# Patient Record
Sex: Female | Born: 1985 | Race: White | Hispanic: No | Marital: Single | Smoking: Current every day smoker
Health system: Southern US, Community
[De-identification: ages and names within clinical notes are randomized; demographics above are authoritative.]

## PROBLEM LIST (undated history)

## (undated) DIAGNOSIS — K5900 Constipation, unspecified: Secondary | ICD-10-CM

## (undated) DIAGNOSIS — J302 Other seasonal allergic rhinitis: Secondary | ICD-10-CM

## (undated) HISTORY — DX: Constipation, unspecified: K59.00

## (undated) HISTORY — PX: OTHER SURGICAL HISTORY: SHX169

## (undated) HISTORY — DX: Other seasonal allergic rhinitis: J30.2

---

## 2005-08-07 ENCOUNTER — Other Ambulatory Visit: Admission: RE | Admit: 2005-08-07 | Discharge: 2005-08-07 | Payer: Self-pay | Admitting: Obstetrics and Gynecology

## 2009-02-09 ENCOUNTER — Emergency Department (HOSPITAL_COMMUNITY): Admission: EM | Admit: 2009-02-09 | Discharge: 2009-02-09 | Payer: Self-pay | Admitting: Emergency Medicine

## 2010-10-03 ENCOUNTER — Emergency Department (HOSPITAL_COMMUNITY)
Admission: EM | Admit: 2010-10-03 | Discharge: 2010-10-03 | Disposition: A | Discharge status: Home / Self Care | Payer: Medicaid Other | Attending: Emergency Medicine | Admitting: Emergency Medicine

## 2010-10-03 DIAGNOSIS — R300 Dysuria: Secondary | ICD-10-CM | POA: Insufficient documentation

## 2010-10-03 DIAGNOSIS — M545 Low back pain, unspecified: Secondary | ICD-10-CM | POA: Insufficient documentation

## 2010-10-03 DIAGNOSIS — N390 Urinary tract infection, site not specified: Secondary | ICD-10-CM | POA: Insufficient documentation

## 2010-10-03 LAB — DIFFERENTIAL
Basophils Relative: 0 % (ref 0–1)
Monocytes Absolute: 1.1 10*3/uL — ABNORMAL HIGH (ref 0.1–1.0)
Monocytes Relative: 8 % (ref 3–12)
Neutro Abs: 11.4 10*3/uL — ABNORMAL HIGH (ref 1.7–7.7)

## 2010-10-03 LAB — URINALYSIS, ROUTINE W REFLEX MICROSCOPIC
Bilirubin Urine: NEGATIVE
Nitrite: POSITIVE — AB
Specific Gravity, Urine: 1.025 (ref 1.005–1.030)
Urobilinogen, UA: 0.2 mg/dL (ref 0.0–1.0)

## 2010-10-03 LAB — URINE MICROSCOPIC-ADD ON

## 2010-10-03 LAB — CBC
HCT: 35.3 % — ABNORMAL LOW (ref 36.0–46.0)
Hemoglobin: 11.9 g/dL — ABNORMAL LOW (ref 12.0–15.0)
MCH: 31.7 pg (ref 26.0–34.0)
MCHC: 33.7 g/dL (ref 30.0–36.0)

## 2010-10-03 LAB — BASIC METABOLIC PANEL
CO2: 25 mEq/L (ref 19–32)
Chloride: 105 mEq/L (ref 96–112)
Creatinine, Ser: 0.51 mg/dL (ref 0.4–1.2)
GFR calc Af Amer: >60 mL/min (ref >60)
Glucose, Bld: 117 mg/dL — ABNORMAL HIGH (ref 70–99)

## 2010-10-06 LAB — URINE CULTURE

## 2010-11-04 ENCOUNTER — Ambulatory Visit (HOSPITAL_COMMUNITY)
Admission: RE | Admit: 2010-11-04 | Discharge: 2010-11-04 | Disposition: A | Discharge status: Home / Self Care | Payer: Medicaid Other | Source: Ambulatory Visit | Attending: Urology | Admitting: Urology

## 2010-11-04 ENCOUNTER — Other Ambulatory Visit (HOSPITAL_COMMUNITY): Payer: Self-pay | Admitting: Urology

## 2010-11-04 DIAGNOSIS — N200 Calculus of kidney: Secondary | ICD-10-CM

## 2010-11-04 DIAGNOSIS — R9389 Abnormal findings on diagnostic imaging of other specified body structures: Secondary | ICD-10-CM | POA: Insufficient documentation

## 2010-11-04 DIAGNOSIS — R1031 Right lower quadrant pain: Secondary | ICD-10-CM | POA: Insufficient documentation

## 2010-12-20 ENCOUNTER — Emergency Department (HOSPITAL_COMMUNITY)
Admission: EM | Admit: 2010-12-20 | Discharge: 2010-12-20 | Disposition: A | Discharge status: Home / Self Care | Payer: Medicaid Other | Attending: Emergency Medicine | Admitting: Emergency Medicine

## 2010-12-20 ENCOUNTER — Encounter: Payer: Self-pay | Admitting: Emergency Medicine

## 2010-12-20 DIAGNOSIS — K089 Disorder of teeth and supporting structures, unspecified: Secondary | ICD-10-CM | POA: Insufficient documentation

## 2010-12-20 DIAGNOSIS — K0889 Other specified disorders of teeth and supporting structures: Secondary | ICD-10-CM

## 2010-12-20 DIAGNOSIS — F17200 Nicotine dependence, unspecified, uncomplicated: Secondary | ICD-10-CM | POA: Insufficient documentation

## 2010-12-20 MED ORDER — PENICILLIN V POTASSIUM 500 MG PO TABS: 500.0000 mg | ORAL_TABLET | Freq: Four times a day (QID) | ORAL | Status: AC

## 2010-12-20 MED ORDER — IBUPROFEN 800 MG PO TABS
800.0000 mg | ORAL_TABLET | Freq: Once | ORAL | Status: AC
  Administered 2010-12-20: 800 mg via ORAL
  Filled 2010-12-20: qty 1

## 2010-12-20 MED ORDER — HYDROCODONE-ACETAMINOPHEN 5-500 MG PO TABS: 1.0000 | ORAL_TABLET | Freq: Four times a day (QID) | ORAL | Status: AC | PRN

## 2010-12-20 MED ORDER — NAPROXEN 500 MG PO TABS: 500.0000 mg | ORAL_TABLET | Freq: Two times a day (BID) | ORAL | Status: AC

## 2010-12-20 NOTE — ED Provider Notes (Signed)
History     Chief Complaint  Patient presents with   Dental Pain   HPI Comments: Pt woke up with throbbing right lower molar pain at 3:30am, pain radiates up to right ear; has tried tylenol and orajel with no relief.  Patient is a 25 y.o. female presenting with tooth pain. The history is provided by the patient.  Dental PainThe primary symptoms include mouth pain. Primary symptoms do not include dental injury, headaches, fever, shortness of breath, sore throat or cough. The symptoms began 2 to 6 hours ago. The symptoms are unchanged. The symptoms are recurrent. The symptoms occur constantly.  Additional symptoms include: ear pain. Additional symptoms do not include: gum swelling, facial swelling and trouble swallowing.    History reviewed. No pertinent past medical history.  History reviewed. No pertinent past surgical history.  History reviewed. No pertinent family history.  History  Substance Use Topics   Smoking status: Current Everyday Smoker -- 0.5 packs/day   Smokeless tobacco: Not on file   Alcohol Use: No    OB History    Grav Para Term Preterm Abortions TAB SAB Ect Mult Living                  Review of Systems  Constitutional: Negative for fever and chills.  HENT: Positive for ear pain and dental problem. Negative for sore throat, facial swelling, trouble swallowing and neck pain.   Eyes: Negative for pain.  Respiratory: Negative for cough and shortness of breath.   Cardiovascular: Negative for chest pain.  Gastrointestinal: Negative for nausea, vomiting and abdominal pain.  Musculoskeletal: Negative for back pain.  Skin: Negative for rash.  Neurological: Negative for headaches.  Hematological: Negative for adenopathy.    Physical Exam  BP 125/84   Pulse 63   Temp(Src) 98.4 F (36.9 C) (Oral)   Resp 16   Ht 5\' 4"  (1.626 m)   SpO2 99%   LMP 12/20/2010  Physical Exam  Nursing note and vitals reviewed. Constitutional: She is oriented to person, place,  and time. She appears well-developed and well-nourished.  HENT:  Head: Normocephalic and atraumatic.  Right Ear: Tympanic membrane and external ear normal.  Left Ear: Tympanic membrane and external ear normal.       Bilateral decay to lingual aspect of last lower molars; tenderness to right last lower molar, no tenderness on left; no surrounding gingival tenderness, swelling or erythema  Eyes: Conjunctivae and EOM are normal.  Neck: Normal range of motion. Neck supple.  Cardiovascular: Normal rate, regular rhythm and normal heart sounds.   Pulmonary/Chest: Effort normal and breath sounds normal. She has no wheezes. She exhibits no tenderness.  Abdominal: Soft. Bowel sounds are normal. There is no tenderness.  Musculoskeletal: Normal range of motion. She exhibits no edema and no tenderness.  Lymphadenopathy:    She has no cervical adenopathy.  Neurological: She is alert and oriented to person, place, and time. She has normal strength. No cranial nerve deficit or sensory deficit.  Skin: Skin is warm and dry. No rash noted.  Psychiatric: She has a normal mood and affect.    ED Course  Procedures  Written by Enos Fling acting as scribe for Dr. Deretha Emory.    MDM

## 2010-12-20 NOTE — ED Notes (Signed)
Pt ready for discharge. Family at bedside. NAD noted at this time.

## 2010-12-20 NOTE — ED Notes (Signed)
Pt c/o toothache since 0330 this am. Denies n/v. nad noted.

## 2014-05-07 ENCOUNTER — Encounter: Payer: Self-pay | Admitting: Internal Medicine

## 2014-05-25 ENCOUNTER — Encounter: Payer: Self-pay | Admitting: Gastroenterology

## 2014-05-25 ENCOUNTER — Ambulatory Visit (INDEPENDENT_AMBULATORY_CARE_PROVIDER_SITE_OTHER): Payer: Medicaid Other | Admitting: Gastroenterology

## 2014-05-25 VITALS — BP 100/53 | HR 82 | Temp 98.0°F | Ht 63.0 in | Wt 108.8 lb

## 2014-05-25 DIAGNOSIS — K5900 Constipation, unspecified: Secondary | ICD-10-CM | POA: Insufficient documentation

## 2014-05-25 MED ORDER — LINACLOTIDE 290 MCG PO CAPS: 290.0000 ug | ORAL_CAPSULE | Freq: Every day | ORAL | Status: DC

## 2014-05-25 MED ORDER — ONDANSETRON HCL 4 MG PO TABS: 4.0000 mg | ORAL_TABLET | Freq: Three times a day (TID) | ORAL | Status: DC | PRN

## 2014-05-25 NOTE — Progress Notes (Signed)
       Primary Care Physician:  Juliette AlcideBURDINE,STEVEN E, MD Primary Gastroenterologist:  Dr. Jena Gaussourk   Chief Complaint  Patient presents with  . Abdominal Pain  . Irritable Bowel Syndrome    change in bowel habits.     HPI:   Leah Clark presents today as a referral from Dr. Leandrew KoyanagiBurdine secondary to constipation. Has seen Dr. Teena DunkBenson in the past, treated for constipation. Tested for H.pylori through PCP, states was positive and treated. States having severe constipation again, feels like stomach is going to explode. Has lower back pain and headaches. Not having a BM unless using Mag Citrate. Was on Amitiza 8 mcg po BID. Notes diffuse abdominal discomfort. Gassy. Feels like it is going to explode. Has had history of intermittent constipation. Symptoms started 2 years ago. Was ok for 5-6 months then symptoms recurred. . No rectal bleeding. Taking Dexilant daily. Underlying nausea.   Past Medical History  Diagnosis Date  . Constipation   . Seasonal allergies     Past Surgical History  Procedure Laterality Date  . None      Current Outpatient Prescriptions  Medication Sig Dispense Refill  . AMITIZA 8 MCG capsule 2 (two) times daily.    Marland Kitchen. dexlansoprazole (DEXILANT) 60 MG capsule Take 60 mg by mouth daily.    Marland Kitchen. loratadine (CLARITIN) 10 MG tablet daily.    Marland Kitchen. UNABLE TO FIND daily. Med Name: birth control     No current facility-administered medications for this visit.    Allergies as of 05/25/2014  . (No Known Allergies)    Family History  Problem Relation Age of Onset  . Colon cancer Neg Hx     History   Social History  . Marital Status: Single    Spouse Name: N/A    Number of Children: 2  . Years of Education: N/A   Occupational History  . Not on file.   Social History Main Topics  . Smoking status: Current Every Day Smoker -- 0.50 packs/day  . Smokeless tobacco: Not on file  . Alcohol Use: No  . Drug Use: No  . Sexual Activity: Yes    Birth Control/ Protection: Pill    Other Topics Concern  . Not on file   Social History Narrative   2 children, 8 and 4.     Review of Systems: As mentioned in HPI.   Physical Exam: BP 100/53 mmHg  Pulse 82  Temp(Src) 98 F (36.7 C) (Oral)  Ht 5\' 3"  (1.6 m)  Wt 108 lb 12.8 oz (49.351 kg)  BMI 19.28 kg/m2  LMP 03/19/2014 (Approximate) General:   Alert and oriented. Pleasant and cooperative. Well-nourished and well-developed.  Head:  Normocephalic and atraumatic. Eyes:  Without icterus, sclera clear and conjunctiva pink.  Ears:  Normal auditory acuity. Nose:  No deformity, discharge,  or lesions. Mouth:  No deformity or lesions, oral mucosa pink.  Lungs:  Clear to auscultation bilaterally. No wheezes, rales, or rhonchi. No distress.  Heart:  S1, S2 present without murmurs appreciated.  Abdomen:  +BS, soft, non-tender and non-distended. No HSM noted. No guarding or rebound. No masses appreciated.  Rectal:  Deferred  Msk:  Symmetrical without gross deformities. Normal posture. Pulses:  Normal pulses noted. Extremities:  Without clubbing or edema. Neurologic:  Alert and  oriented x4;  grossly normal neurologically. Skin:  Intact without significant lesions or rashes. Psych:  Alert and cooperative. Normal mood and affect.

## 2014-05-25 NOTE — Patient Instructions (Addendum)
Stop Amitiza. Start taking Linzess 1 capsule each morning at least 30 minutes before breakfast. You may have loose stool the first few days, but this should become regular. If not, call us.   Start taking a probiotic daily. We provided Align samples. Other examples are Digestive Advantage, [employer] Health, [employer] brand.   Please call in 1 week with an update. If things are not improving, we will need to do some further work-up.

## 2014-05-31 ENCOUNTER — Telehealth: Payer: Self-pay | Admitting: Internal Medicine

## 2014-05-31 NOTE — Telephone Encounter (Signed)
Pt called to let JL know that she has been taking Linzess and hasn't had a BM since. Please advise and call [phone]

## 2014-05-31 NOTE — Assessment & Plan Note (Signed)
28 year old female with chronic constipation on Amitiza 8 mcg po BID without much improvement. No rectal bleeding or other concerning factors. Likely nausea secondary to constipation; will aggressively treat constipation. If nausea persists despite bowel regimen, consider further work-up. Start Linzess 290 mcg daily, probiotic daily, and 1 week progress report.

## 2014-06-01 NOTE — Telephone Encounter (Signed)
Routing to AS 

## 2014-06-04 NOTE — Telephone Encounter (Signed)
Is she taking Linzess 290 mcg every single day? Has she had NO stool output whatsoever? Could try the novel approach of taking WITH her breakfast.

## 2014-06-04 NOTE — Telephone Encounter (Signed)
I recommend a colonoscopy with Dr. Jena Gaussourk in mid January. In interim, take Linzess with food. May add Miralax one capful daily to BID in addition if needed.

## 2014-06-04 NOTE — Telephone Encounter (Signed)
Pt is aware. She will try to take it with food. She wanted to let AS know that on Saturday she saw bright red blood with her BM and today she saw bright red blood with her BM.

## 2014-06-06 ENCOUNTER — Other Ambulatory Visit: Payer: Self-pay

## 2014-06-06 DIAGNOSIS — K5900 Constipation, unspecified: Secondary | ICD-10-CM

## 2014-06-06 MED ORDER — PEG-KCL-NACL-NASULF-NA ASC-C 100 G PO SOLR: 1.0000 | Freq: Once | ORAL | Status: AC

## 2014-06-06 NOTE — Telephone Encounter (Signed)
Pt is schedule for TCS with RMR on 06/18/2014

## 2014-06-06 NOTE — Telephone Encounter (Signed)
Pt is aware, she is already taking miralax qd, she will change to bid. Ok to schedule tcs.   Please schedule.

## 2014-06-13 ENCOUNTER — Telehealth: Payer: Self-pay

## 2014-06-13 NOTE — Telephone Encounter (Signed)
Noted  

## 2014-06-13 NOTE — Telephone Encounter (Signed)
Pt called to cancel her TCS for Monday due to her being on her period. I informed her that she will have to come back into the office before we can reschedule the TSC.

## 2014-06-18 ENCOUNTER — Encounter (HOSPITAL_COMMUNITY): Admission: RE | Payer: Self-pay | Source: Ambulatory Visit

## 2014-06-18 ENCOUNTER — Ambulatory Visit (HOSPITAL_COMMUNITY): Admission: RE | Admit: 2014-06-18 | Payer: Medicaid Other | Source: Ambulatory Visit | Admitting: Internal Medicine

## 2014-06-18 SURGERY — COLONOSCOPY
Anesthesia: Moderate Sedation

## 2014-06-22 ENCOUNTER — Ambulatory Visit: Payer: Medicaid Other | Admitting: Gastroenterology

## 2014-06-26 NOTE — Progress Notes (Signed)
cc'ed to pcp °

## 2014-07-25 ENCOUNTER — Encounter: Payer: Self-pay | Admitting: Gastroenterology

## 2014-07-25 ENCOUNTER — Other Ambulatory Visit: Payer: Self-pay

## 2014-07-25 ENCOUNTER — Ambulatory Visit (INDEPENDENT_AMBULATORY_CARE_PROVIDER_SITE_OTHER): Payer: Medicaid Other | Admitting: Gastroenterology

## 2014-07-25 VITALS — BP 107/74 | HR 86 | Temp 98.1°F | Ht 63.0 in | Wt 104.0 lb

## 2014-07-25 DIAGNOSIS — K5900 Constipation, unspecified: Secondary | ICD-10-CM

## 2014-07-25 DIAGNOSIS — K625 Hemorrhage of anus and rectum: Secondary | ICD-10-CM | POA: Insufficient documentation

## 2014-07-25 MED ORDER — PEG 3350-KCL-NA BICARB-NACL 420 G PO SOLR: 4000.0000 mL | Freq: Once | ORAL | Status: DC

## 2014-07-25 NOTE — Progress Notes (Signed)
    Referring Provider: Juliette AlcideBurdine, Steven E, MD Primary Care Physician:  Juliette AlcideBURDINE,STEVEN E, MD  Primary GI: Dr. Jena Gaussourk  Chief Complaint  Patient presents with  . update H&P    HPI:   Leah Clark is a 29 y.o. female presenting today with a history of chronic constipation, recent low-volume hematochezia. Initially evaluated Dec 2015 and set up for a colonoscopy; however, she cancelled this. Needs updated H&P now.   Stopped taking Linzess because would feel like she would be able to go but then nothing happened. Even took with food. Now back on Amitiza 8 mcg BID. Feeling better since not eating as much, was able to go on her own. Noted intermittent low-volume hematochezia a few weeks ago but none since. Underlying nausea, no vomiting. Zofran helps with nausea. Has not tried Amitiza 24 mcg.   Past Medical History  Diagnosis Date  . Constipation   . Seasonal allergies     Past Surgical History  Procedure Laterality Date  . None      Current Outpatient Prescriptions  Medication Sig Dispense Refill  . AMITIZA 8 MCG capsule 2 (two) times daily.    Marland Kitchen. dexlansoprazole (DEXILANT) 60 MG capsule Take 60 mg by mouth daily.    Marland Kitchen. loratadine (CLARITIN) 10 MG tablet daily.    . ondansetron (ZOFRAN) 4 MG tablet Take 1 tablet (4 mg total) by mouth every 8 (eight) hours as needed for nausea or vomiting. 30 tablet 1  . UNABLE TO FIND daily. Med Name: birth control    . Linaclotide (LINZESS) 290 MCG CAPS capsule Take 1 capsule (290 mcg total) by mouth daily. At least 30 minutes prior to breakfast (Patient not taking: Reported on 07/25/2014) 30 capsule 5   No current facility-administered medications for this visit.    Allergies as of 07/25/2014  . (No Known Allergies)    Family History  Problem Relation Age of Onset  . Colon cancer Neg Hx     History   Social History  . Marital Status: Single    Spouse Name: N/A  . Number of Children: 2  . Years of Education: N/A   Social History Main  Topics  . Smoking status: Current Every Day Smoker -- 0.50 packs/day  . Smokeless tobacco: Not on file  . Alcohol Use: No  . Drug Use: No  . Sexual Activity: Yes    Birth Control/ Protection: Pill   Other Topics Concern  . None   Social History Narrative   2 children, 8 and 4.     Review of Systems: As mentioned in HPI.   Physical Exam: BP 107/74 mmHg  Pulse 86  Temp(Src) 98.1 F (36.7 C) (Oral)  Ht 5\' 3"  (1.6 m)  Wt 104 lb (47.174 kg)  BMI 18.43 kg/m2  LMP 06/04/2014 General:   Alert and oriented. No distress noted. Pleasant and cooperative.  Head:  Normocephalic and atraumatic. Eyes:  Conjuctiva clear without scleral icterus. Mouth:  Oral mucosa pink and moist. Good dentition. No lesions. Heart:  S1, S2 present without murmurs, rubs, or gallops. Regular rate and rhythm. Abdomen:  +BS, soft, non-tender and non-distended. No rebound or guarding. No HSM or masses noted. Msk:  Symmetrical without gross deformities. Normal posture. Extremities:  Without edema. Neurologic:  Alert and  oriented x4;  grossly normal neurologically. Skin:  Intact without significant lesions or rashes. Psych:  Alert and cooperative. Normal mood and affect.

## 2014-07-25 NOTE — Assessment & Plan Note (Addendum)
29 year old female with recent low-volume hematochezia in the setting of chronic constipation. Likely benign anorectal source. However, will proceed with colonoscopy in the near future to rule out any occult process.   Proceed with TCS with Dr. Jena Gaussourk in near future: the risks, benefits, and alternatives have been discussed with the patient in detail. The patient states understanding and desires to proceed. Pregnancy screen due to child-bearing age

## 2014-07-25 NOTE — Patient Instructions (Signed)
Please have blood work done today.   We have scheduled you for a colonoscopy with Dr. Jena Gaussourk in the near future.

## 2014-07-25 NOTE — Assessment & Plan Note (Signed)
Chronic constipation, no improvement with Linzess 290 mcg or Amitiza BID. Increase to Amitiza 24 mcg po BID. Check CBC, CMP, TSH for completeness. Underlying nausea likely secondary to chronic constipation. Continue Dexilant for now. Further work-up of nausea if persists despite aggressive bowel regimen.

## 2014-07-28 LAB — COMPREHENSIVE METABOLIC PANEL
ALBUMIN: 4.1 g/dL (ref 3.5–5.2)
ALK PHOS: 64 U/L (ref 39–117)
ALT: 10 U/L (ref 0–35)
AST: 17 U/L (ref 0–37)
BUN: 15 mg/dL (ref 6–23)
CALCIUM: 9.3 mg/dL (ref 8.4–10.5)
CHLORIDE: 105 meq/L (ref 96–112)
CO2: 26 meq/L (ref 19–32)
Creat: 0.66 mg/dL (ref 0.50–1.10)
Glucose, Bld: 58 mg/dL — ABNORMAL LOW (ref 70–99)
Potassium: 3.2 mEq/L — ABNORMAL LOW (ref 3.5–5.3)
Sodium: 139 mEq/L (ref 135–145)
Total Bilirubin: 0.4 mg/dL (ref 0.2–1.2)
Total Protein: 6.3 g/dL (ref 6.0–8.3)

## 2014-07-28 LAB — CBC
HCT: 36.5 % (ref 36.0–46.0)
Hemoglobin: 12.3 g/dL (ref 12.0–15.0)
MCH: 31.5 pg (ref 26.0–34.0)
MCHC: 33.7 g/dL (ref 30.0–36.0)
MCV: 93.4 fL (ref 78.0–100.0)
MPV: 10.4 fL (ref 8.6–12.4)
PLATELETS: 293 10*3/uL (ref 150–400)
RBC: 3.91 MIL/uL (ref 3.87–5.11)
RDW: 12.7 % (ref 11.5–15.5)
WBC: 7.8 10*3/uL (ref 4.0–10.5)

## 2014-07-28 LAB — TSH: TSH: 0.819 u[IU]/mL (ref 0.350–4.500)

## 2014-07-28 LAB — HCG, QUANTITATIVE, PREGNANCY

## 2014-07-31 ENCOUNTER — Other Ambulatory Visit: Payer: Self-pay

## 2014-08-01 MED ORDER — LUBIPROSTONE 8 MCG PO CAPS: 8.0000 ug | ORAL_CAPSULE | Freq: Two times a day (BID) | ORAL | Status: DC

## 2014-08-02 ENCOUNTER — Other Ambulatory Visit: Payer: Self-pay

## 2014-08-04 NOTE — Progress Notes (Signed)
CC'ED TO PCP 

## 2014-08-05 MED ORDER — ONDANSETRON HCL 4 MG PO TABS: 4.0000 mg | ORAL_TABLET | Freq: Three times a day (TID) | ORAL | Status: DC | PRN

## 2014-08-08 NOTE — Progress Notes (Signed)
Quick Note:  Negative pregnancy screen.  TSH normal No anemia.  Please recheck BMP. Potassium just mildly low at 3.2. ______ 

## 2014-08-09 ENCOUNTER — Other Ambulatory Visit: Payer: Self-pay | Admitting: Gastroenterology

## 2014-08-09 ENCOUNTER — Other Ambulatory Visit: Payer: Self-pay

## 2014-08-09 DIAGNOSIS — E876 Hypokalemia: Secondary | ICD-10-CM

## 2014-08-13 ENCOUNTER — Other Ambulatory Visit: Payer: Self-pay

## 2014-08-14 ENCOUNTER — Telehealth: Payer: Self-pay

## 2014-08-14 LAB — BASIC METABOLIC PANEL
BUN: 15 mg/dL (ref 6–23)
CHLORIDE: 107 meq/L (ref 96–112)
CO2: 23 meq/L (ref 19–32)
Calcium: 8.8 mg/dL (ref 8.4–10.5)
Creat: 0.61 mg/dL (ref 0.50–1.10)
Glucose, Bld: 86 mg/dL (ref 70–99)
POTASSIUM: 3.7 meq/L (ref 3.5–5.3)
Sodium: 140 mEq/L (ref 135–145)

## 2014-08-14 NOTE — Telephone Encounter (Signed)
Pt called and states she was in the ER last night and that she had a CT scan. Wants Dr. Jena Gaussourk to review CT and let her know if she still needs to have Colonoscopy tomorrow.   Please advise

## 2014-08-14 NOTE — Progress Notes (Signed)
Quick Note:  Potassium now normal. ______

## 2014-08-14 NOTE — Telephone Encounter (Signed)
Opened In Error

## 2014-08-14 NOTE — Telephone Encounter (Signed)
Pt documentation was intended for some one else.

## 2014-08-15 ENCOUNTER — Encounter (HOSPITAL_COMMUNITY)
Admission: RE | Disposition: A | Discharge status: Home / Self Care | Payer: Self-pay | Source: Ambulatory Visit | Attending: Internal Medicine

## 2014-08-15 ENCOUNTER — Encounter (HOSPITAL_COMMUNITY): Payer: Self-pay | Admitting: *Deleted

## 2014-08-15 ENCOUNTER — Ambulatory Visit (HOSPITAL_COMMUNITY)
Admission: RE | Admit: 2014-08-15 | Discharge: 2014-08-15 | Disposition: A | Discharge status: Home / Self Care | Payer: Medicaid Other | Source: Ambulatory Visit | Attending: Internal Medicine | Admitting: Internal Medicine

## 2014-08-15 DIAGNOSIS — K625 Hemorrhage of anus and rectum: Secondary | ICD-10-CM

## 2014-08-15 DIAGNOSIS — K921 Melena: Secondary | ICD-10-CM | POA: Diagnosis not present

## 2014-08-15 HISTORY — PX: COLONOSCOPY: SHX5424

## 2014-08-15 SURGERY — COLONOSCOPY
Anesthesia: Moderate Sedation

## 2014-08-15 MED ORDER — STERILE WATER FOR IRRIGATION IR SOLN
Status: DC | PRN
  Administered 2014-08-15: 08:00:00

## 2014-08-15 MED ORDER — MIDAZOLAM HCL 5 MG/5ML IJ SOLN
INTRAMUSCULAR | Status: DC | PRN
  Administered 2014-08-15 (×3): 2 mg via INTRAVENOUS
  Administered 2014-08-15: 1 mg via INTRAVENOUS
  Administered 2014-08-15: 2 mg via INTRAVENOUS

## 2014-08-15 MED ORDER — LIDOCAINE VISCOUS 2 % MT SOLN
OROMUCOSAL | Status: AC
  Filled 2014-08-15: qty 15

## 2014-08-15 MED ORDER — SODIUM CHLORIDE 0.9 % IV SOLN
INTRAVENOUS | Status: DC
  Administered 2014-08-15: 1000 mL via INTRAVENOUS

## 2014-08-15 MED ORDER — SODIUM CHLORIDE 0.9 % IJ SOLN
INTRAMUSCULAR | Status: AC
  Filled 2014-08-15: qty 3

## 2014-08-15 MED ORDER — PROMETHAZINE HCL 25 MG/ML IJ SOLN
INTRAMUSCULAR | Status: AC
  Filled 2014-08-15: qty 1

## 2014-08-15 MED ORDER — PROMETHAZINE HCL 25 MG/ML IJ SOLN
INTRAMUSCULAR | Status: DC | PRN
  Administered 2014-08-15: 12.5 mg via INTRAVENOUS

## 2014-08-15 MED ORDER — MIDAZOLAM HCL 5 MG/5ML IJ SOLN
INTRAMUSCULAR | Status: AC
  Filled 2014-08-15: qty 10

## 2014-08-15 MED ORDER — ONDANSETRON HCL 4 MG/2ML IJ SOLN
INTRAMUSCULAR | Status: DC | PRN
  Administered 2014-08-15: 4 mg via INTRAVENOUS

## 2014-08-15 MED ORDER — ONDANSETRON HCL 4 MG/2ML IJ SOLN
INTRAMUSCULAR | Status: AC
  Filled 2014-08-15: qty 2

## 2014-08-15 MED ORDER — MEPERIDINE HCL 100 MG/ML IJ SOLN
INTRAMUSCULAR | Status: AC
  Filled 2014-08-15: qty 2

## 2014-08-15 MED ORDER — MEPERIDINE HCL 100 MG/ML IJ SOLN
INTRAMUSCULAR | Status: DC | PRN
  Administered 2014-08-15 (×3): 50 mg via INTRAVENOUS

## 2014-08-15 NOTE — Discharge Instructions (Signed)
Colonoscopy Discharge Instructions  Read the instructions outlined below and refer to this sheet in the next few weeks. These discharge instructions provide you with general information on caring for yourself after you leave the hospital. Your doctor may also give you specific instructions. While your treatment has been planned according to the most current medical practices available, unavoidable complications occasionally occur. If you have any problems or questions after discharge, call Dr. Jena Gaussourk at [phone]. ACTIVITY  You may resume your regular activity, but move at a slower pace for the next 24 hours.   Take frequent rest periods for the next 24 hours.   Walking will help get rid of the air and reduce the bloated feeling in your belly (abdomen).   No driving for 24 hours (because of the medicine (anesthesia) used during the test).    Do not sign any important legal documents or operate any machinery for 24 hours (because of the anesthesia used during the test).  NUTRITION  Drink plenty of fluids.   You may resume your normal diet as instructed by your doctor.   Begin with a light meal and progress to your normal diet. Heavy or fried foods are harder to digest and may make you feel sick to your stomach (nauseated).   Avoid alcoholic beverages for 24 hours or as instructed.  MEDICATIONS  You may resume your normal medications unless your doctor tells you otherwise.  WHAT YOU CAN EXPECT TODAY  Some feelings of bloating in the abdomen.   Passage of more gas than usual.   Spotting of blood in your stool or on the toilet paper.  IF YOU HAD POLYPS REMOVED DURING THE COLONOSCOPY:  No aspirin products for 7 days or as instructed.   No alcohol for 7 days or as instructed.   Eat a soft diet for the next 24 hours.  FINDING OUT THE RESULTS OF YOUR TEST Not all test results are available during your visit. If your test results are not back during the visit, make an appointment  with your caregiver to find out the results. Do not assume everything is normal if you have not heard from your caregiver or the medical facility. It is important for you to follow up on all of your test results.  SEEK IMMEDIATE MEDICAL ATTENTION IF:  You have more than a spotting of blood in your stool.   Your belly is swollen (abdominal distention).   You are nauseated or vomiting.   You have a temperature over 101.   You have abdominal pain or discomfort that is severe or gets worse throughout the day.   Constipation information provided  Begin Colace 100 mg twice daily.  Resume Amitiza 24 g gelcap one twice daily during meals  Office visit with us in 3 months.  Constipation Constipation is when a person: Poops (has a bowel movement) less than 3 times a week. Has a hard time pooping. Has poop that is dry, hard, or bigger than normal. HOME CARE  Eat foods with a lot of fiber in them. This includes fruits, vegetables, beans, and whole grains such as brown rice. Avoid fatty foods and foods with a lot of sugar. This includes french fries, hamburgers, cookies, candy, and soda. If you are not getting enough fiber from food, take products with added fiber in them (supplements). Drink enough fluid to keep your pee (urine) clear or pale yellow. Exercise on a regular basis, or as told by your doctor. Go to the restroom when you  feel like you need to poop. Do not hold it. Only take medicine as told by your doctor. Do not take medicines that help you poop (laxatives) without talking to your doctor first. GET HELP RIGHT AWAY IF:  You have bright red blood in your poop (stool). Your constipation lasts more than 4 days or gets worse. You have belly (abdominal) or butt (rectal) pain. You have thin poop (as thin as a pencil). You lose weight, and it cannot be explained. MAKE SURE YOU:  Understand these instructions. Will watch your condition. Will get help right away if you are not  doing well or get worse. Document Released: 11/18/2007 Document Revised: 06/06/2013 Document Reviewed: 03/13/2013 [facility] Patient Information 2015 [place], [place]. This information is not intended to replace advice given to you by your health care provider. Make sure you discuss any questions you have with your health care provider.

## 2014-08-15 NOTE — Op Note (Signed)
[facility] [address] [place], [id number]   COLONOSCOPY PROCEDURE REPORT  PATIENT: Leah Clark, Leah Clark  MR#: [id number] BIRTHDATE: 31-Oct-1985 , 29  yrs. old GENDER: female ENDOSCOPIST: R.  Roetta SessionsMichael Rourk, MD FACP [facility] REFERRED QI:ONGEXBBY:Steven Leandrew KoyanagiBurdine, Clark.D. PROCEDURE DATE:  08/15/2014 PROCEDURE:   Colonoscopy, diagnostic INDICATIONS:Paper hematochezia. MEDICATIONS: Versed 9 mg IV and Demerol 150 mg IV in divided doses. Zofran 4 mg IV.  Phenergan 12.5 mg IV ASA CLASS:       Class II  CONSENT: The risks, benefits, alternatives and imponderables including but not limited to bleeding, perforation as well as the possibility of a missed lesion have been reviewed.  The potential for biopsy, lesion removal, etc. have also been discussed. Questions have been answered.  All parties agreeable.  Please see the history and physical in the medical record for more information.  DESCRIPTION OF PROCEDURE:   After the risks benefits and alternatives of the procedure were thoroughly explained, informed consent was obtained.  The digital rectal exam revealed no rectal mass.   The EC-[id number] ([id number]  endoscope was introduced through the anus and advanced to the cecum, which was identified by both the appendix and ileocecal valve. No adverse events experienced. The quality of the prep was adequate  The instrument was then slowly withdrawn as the colon was fully examined.      COLON FINDINGS: Normal-appearing rectal mucosa.  Normal-appearing colonic mucosa.  Retroflexion was performed. .  Withdrawal time=6 minutes 0 seconds.  The scope was withdrawn and the procedure completed. COMPLICATIONS: There were no immediate complications.  ENDOSCOPIC IMPRESSION: Normal colonoscopy. I suspect benign anorectal bleeding in the setting of constipation.  RECOMMENDATIONS: Resume Amitiza 24 (1 )  gel cap twice daily. Colace 100 mg twice daily. Office visit with us in 3 months.  eSigned:  R.  Roetta SessionsMichael Rourk, MD Jerrel IvoryFACP [facility] 08/15/2014 9:28 AM   cc:  CPT CODES: ICD CODES:  The ICD and CPT codes recommended by this software are interpretations from the data that the clinical staff has captured with the software.  The verification of the translation of this report to the ICD and CPT codes and modifiers is the sole responsibility of the health care institution and practicing physician where this report was generated.  [employer] will not be held responsible for the validity of the ICD and CPT codes included on this report.  AMA assumes no liability for data contained or not contained herein. CPT is a Publishing rights managerregistered trademark of the [employer].

## 2014-08-16 ENCOUNTER — Encounter (HOSPITAL_COMMUNITY): Payer: Self-pay | Admitting: Internal Medicine

## 2014-08-16 NOTE — Interval H&P Note (Signed)
History and Physical Interval Note:  08/16/2014 12:32 PM  Leah Clark  has presented today for surgery, with the diagnosis of rectal bleeding  The various methods of treatment have been discussed with the patient and family. After consideration of risks, benefits and other options for treatment, the patient has consented to  Procedure(s) with comments: COLONOSCOPY (N/A) - 0830am as a surgical intervention .  The patient's history has been reviewed, patient examined, no change in status, stable for surgery.  I have reviewed the patient's chart and labs.  Questions were answered to the patient's satisfaction.     Eula Listenobert Muhammed Teutsch

## 2014-08-16 NOTE — H&P (View-Only) (Signed)
Quick Note:  Negative pregnancy screen.  TSH normal No anemia.  Please recheck BMP. Potassium just mildly low at 3.2. ______

## 2014-08-24 NOTE — Interval H&P Note (Signed)
History and Physical Interval Note:  08/24/2014 8:20 AM  Geanie BerlinStaci Arlyce DiceM Clark  has presented today for surgery, with the diagnosis of rectal bleeding  The various methods of treatment have been discussed with the patient and family. After consideration of risks, benefits and other options for treatment, the patient has consented to  Procedure(s) with comments: COLONOSCOPY (N/A) - 0830am as a surgical intervention .  The patient's history has been reviewed, patient examined, no change in status, stable for surgery.  I have reviewed the patient's chart and labs.  Questions were answered to the patient's satisfaction.     Eula Listenobert Deundra Furber

## 2014-08-24 NOTE — H&P (View-Only) (Signed)
    Referring Provider: Juliette AlcideBurdine, Steven E, MD Primary Care Physician:  Juliette AlcideBURDINE,STEVEN E, MD  Primary GI: Dr. Jena Gaussourk  Chief Complaint  Patient presents with  . update H&P    HPI:   Leah Clark is a 29 y.o. female presenting today with a history of chronic constipation, recent low-volume hematochezia. Initially evaluated Dec 2015 and set up for a colonoscopy; however, she cancelled this. Needs updated H&P now.   Stopped taking Linzess because would feel like she would be able to go but then nothing happened. Even took with food. Now back on Amitiza 8 mcg BID. Feeling better since not eating as much, was able to go on her own. Noted intermittent low-volume hematochezia a few weeks ago but none since. Underlying nausea, no vomiting. Zofran helps with nausea. Has not tried Amitiza 24 mcg.   Past Medical History  Diagnosis Date  . Constipation   . Seasonal allergies     Past Surgical History  Procedure Laterality Date  . None      Current Outpatient Prescriptions  Medication Sig Dispense Refill  . AMITIZA 8 MCG capsule 2 (two) times daily.    Marland Kitchen. dexlansoprazole (DEXILANT) 60 MG capsule Take 60 mg by mouth daily.    Marland Kitchen. loratadine (CLARITIN) 10 MG tablet daily.    . ondansetron (ZOFRAN) 4 MG tablet Take 1 tablet (4 mg total) by mouth every 8 (eight) hours as needed for nausea or vomiting. 30 tablet 1  . UNABLE TO FIND daily. Med Name: birth control    . Linaclotide (LINZESS) 290 MCG CAPS capsule Take 1 capsule (290 mcg total) by mouth daily. At least 30 minutes prior to breakfast (Patient not taking: Reported on 07/25/2014) 30 capsule 5   No current facility-administered medications for this visit.    Allergies as of 07/25/2014  . (No Known Allergies)    Family History  Problem Relation Age of Onset  . Colon cancer Neg Hx     History   Social History  . Marital Status: Single    Spouse Name: N/A  . Number of Children: 2  . Years of Education: N/A   Social History Main  Topics  . Smoking status: Current Every Day Smoker -- 0.50 packs/day  . Smokeless tobacco: Not on file  . Alcohol Use: No  . Drug Use: No  . Sexual Activity: Yes    Birth Control/ Protection: Pill   Other Topics Concern  . None   Social History Narrative   2 children, 8 and 4.     Review of Systems: As mentioned in HPI.   Physical Exam: BP 107/74 mmHg  Pulse 86  Temp(Src) 98.1 F (36.7 C) (Oral)  Ht 5\' 3"  (1.6 m)  Wt 104 lb (47.174 kg)  BMI 18.43 kg/m2  LMP 06/04/2014 General:   Alert and oriented. No distress noted. Pleasant and cooperative.  Head:  Normocephalic and atraumatic. Eyes:  Conjuctiva clear without scleral icterus. Mouth:  Oral mucosa pink and moist. Good dentition. No lesions. Heart:  S1, S2 present without murmurs, rubs, or gallops. Regular rate and rhythm. Abdomen:  +BS, soft, non-tender and non-distended. No rebound or guarding. No HSM or masses noted. Msk:  Symmetrical without gross deformities. Normal posture. Extremities:  Without edema. Neurologic:  Alert and  oriented x4;  grossly normal neurologically. Skin:  Intact without significant lesions or rashes. Psych:  Alert and cooperative. Normal mood and affect.

## 2014-10-05 ENCOUNTER — Other Ambulatory Visit: Payer: Self-pay | Admitting: Nurse Practitioner

## 2014-10-08 ENCOUNTER — Ambulatory Visit (INDEPENDENT_AMBULATORY_CARE_PROVIDER_SITE_OTHER): Payer: Medicaid Other | Admitting: Gastroenterology

## 2014-10-08 ENCOUNTER — Encounter: Payer: Self-pay | Admitting: Gastroenterology

## 2014-10-08 VITALS — BP 100/62 | HR 72 | Temp 97.8°F | Ht 63.0 in | Wt 102.8 lb

## 2014-10-08 DIAGNOSIS — K5909 Other constipation: Secondary | ICD-10-CM

## 2014-10-08 DIAGNOSIS — K219 Gastro-esophageal reflux disease without esophagitis: Secondary | ICD-10-CM | POA: Diagnosis not present

## 2014-10-08 DIAGNOSIS — K5903 Drug induced constipation: Secondary | ICD-10-CM | POA: Insufficient documentation

## 2014-10-08 MED ORDER — PANTOPRAZOLE SODIUM 40 MG PO TBEC: 40.0000 mg | DELAYED_RELEASE_TABLET | Freq: Two times a day (BID) | ORAL | Status: DC

## 2014-10-08 MED ORDER — NALOXEGOL OXALATE 25 MG PO TABS: 25.0000 mg | ORAL_TABLET | Freq: Every day | ORAL | Status: DC

## 2014-10-08 NOTE — Progress Notes (Signed)
Primary Care Physician: Juliette AlcideBURDINE,STEVEN E, MD  Primary Gastroenterologist:  Roetta SessionsMichael Rourk, MD   Chief Complaint  Patient presents with  . Constipation    HPI: Leah DesanctisStaci M Clark is a 29 y.o. female here for follow-up. She has a history of chronic constipation and low volume hematochezia. Colonoscopy back in March was normal. Previously was on Linzess 290mcg daily but wasn't helping. Went from Amitiza 8 g twice a day to 24 g twice a day over the past couple months. TSH and serum calcium were normal. No anemia. Patient had a CT of abdomen and pelvis back in January 2016 at an outside facility that showed moderate stool burden.  Current regimen Amitiza 24mcg BID and Colace 100mg  BID. Has only one BM per weekly. MagCitrate at least once per week. Took 2 bottles recently and didn't go. At this time last BM almost a week ago. Complains of associated abdominal bloating, nausea. Affects appetite. Has lost a couple of pounds. No vomiting. No rectal bleeding, melena.  GERD uncontrolled. Was on Dexilant daily with breakthrough symptoms. PCP increased to BID but insurance would not pay for BID. Currently not on anything. Bad heartburn especially at night but throughout the day. Previously on Nexium but Medicaid didn't want to pay for it. Has tried omeprazole and pantoprazole once daily but had breakthrough symptoms at night. No dysphagia.    Upon further questioning, she has h/o opiod use. Currently takes Suboxone daily. Embarrassed to tell us previously, didn't want to be judged.    Current Outpatient Prescriptions  Medication Sig Dispense Refill  . loratadine (CLARITIN) 10 MG tablet Take 10 mg by mouth daily.     Marland Kitchen. lubiprostone (AMITIZA) 24 MCG capsule Take 24 mcg by mouth 2 (two) times daily with a meal.    . ondansetron (ZOFRAN) 4 MG tablet Take 1 tablet (4 mg total) by mouth every 8 (eight) hours as needed for nausea or vomiting. 30 tablet 1  . PRESCRIPTION MEDICATION Take 1 tablet by mouth  daily. Birth control    . SUBOXONE 8-2 MG FILM      No current facility-administered medications for this visit.    Allergies as of 10/08/2014  . (No Known Allergies)    ROS:  General: Negative for anorexia, weight loss, fever, chills, fatigue, weakness. ENT: Negative for hoarseness, difficulty swallowing , nasal congestion. CV: Negative for chest pain, angina, palpitations, dyspnea on exertion, peripheral edema.  Respiratory: Negative for dyspnea at rest, dyspnea on exertion, cough, sputum, wheezing.  GI: See history of present illness. GU:  Negative for dysuria, hematuria, urinary incontinence, urinary frequency, nocturnal urination.  Endo: Negative for unusual weight change.    Physical Examination:   BP 100/62 mmHg  Pulse 72  Temp(Src) 97.8 F (36.6 C) (Oral)  Ht 5\' 3"  (1.6 m)  Wt 102 lb 12.8 oz (46.63 kg)  BMI 18.21 kg/m2  LMP 10/08/2014  General: Well-nourished, well-developed in no acute distress.  Eyes: No icterus. Mouth: Oropharyngeal mucosa moist and pink , no lesions erythema or exudate. Lungs: Clear to auscultation bilaterally.  Heart: Regular rate and rhythm, no murmurs rubs or gallops.  Abdomen: Bowel sounds are normal, nontender, nondistended, no hepatosplenomegaly or masses, no abdominal bruits or hernia , no rebound or guarding.   Extremities: No lower extremity edema. No clubbing or deformities. Neuro: Alert and oriented x 4   Skin: Warm and dry, no jaundice.   Psych: Alert and cooperative, normal mood and affect.  Labs:  Lab Results  Component Value Date   WBC 7.8 07/25/2014   HGB 12.3 07/25/2014   HCT 36.5 07/25/2014   MCV 93.4 07/25/2014   PLT 293 07/25/2014   Lab Results  Component Value Date   CREATININE 0.61 08/13/2014   BUN 15 08/13/2014   NA 140 08/13/2014   K 3.7 08/13/2014   CL 107 08/13/2014   CO2 23 08/13/2014   Lab Results  Component Value Date   TSH 0.819 07/25/2014    Imaging Studies: No results found.

## 2014-10-08 NOTE — Assessment & Plan Note (Signed)
29 year old female with typical GERD symptoms, refractory to therapy. Breakthrough symptoms on Dexilant 60 mg daily. Was doing well on twice a day therapy but not approved by insurance. Suggest trying pantoprazole 40 mg twice a day. While we await approval from Medicaid, she can take Dexilant 60 mg twice a day, samples provided today. Return to the office in 2 months or call sooner if needed. Discussed antireflux measures.

## 2014-10-08 NOTE — Patient Instructions (Signed)
1. Start Movantik 25mg  once daily 1 hour before breakfast.  Samples provided. Also sent RX to your pharmacy. 2. Continue Amitiza until more frequent BMs, then you may stop to see if you can be controlled with just Movantik. 3. Start pantoprazole twice daily before breakfast and evening meal for heartburn. We will have to get approved for twice per day by your insurance. In the meantime, I have given you samples of Dexilant to take twice a day. 4. Return office visit in two months. Call sooner if regimen not working.

## 2014-10-08 NOTE — Progress Notes (Signed)
cc'ed to pcp °

## 2014-10-08 NOTE — Assessment & Plan Note (Signed)
29 year old lady with history of chronic constipation worsening of the last 6 months to a year. Admits to opioid use today. Previously has failed Linzess and Amitiza. Suggest Movantik 25 mg daily. She will continue Amitiza until regular bowel movements and then may try to wean off of Amitiza. Samples and prescription provided. She will call within a couple of weeks if no noted improvement. Otherwise return to the office in 2 months.  Advised to consume plenty of water, at least 64 ounces daily. Daily exercise as tolerated. Increase dietary fiber slowly.

## 2014-10-26 ENCOUNTER — Encounter: Payer: Self-pay | Admitting: Internal Medicine

## 2014-11-15 ENCOUNTER — Ambulatory Visit: Payer: Medicaid Other | Admitting: Nurse Practitioner

## 2014-12-03 ENCOUNTER — Ambulatory Visit: Payer: Medicaid Other | Admitting: Gastroenterology

## 2014-12-04 ENCOUNTER — Other Ambulatory Visit: Payer: Self-pay

## 2014-12-05 MED ORDER — ONDANSETRON HCL 4 MG PO TABS: 4.0000 mg | ORAL_TABLET | Freq: Three times a day (TID) | ORAL | Status: DC | PRN

## 2014-12-26 ENCOUNTER — Ambulatory Visit: Payer: Medicaid Other | Admitting: Gastroenterology

## 2014-12-26 ENCOUNTER — Telehealth: Payer: Self-pay | Admitting: Gastroenterology

## 2014-12-26 NOTE — Telephone Encounter (Signed)
Pt was a no show

## 2015-01-19 ENCOUNTER — Other Ambulatory Visit: Payer: Self-pay | Admitting: Gastroenterology

## 2015-02-05 ENCOUNTER — Other Ambulatory Visit: Payer: Self-pay | Admitting: Nurse Practitioner

## 2015-02-25 ENCOUNTER — Telehealth: Payer: Self-pay | Admitting: Internal Medicine

## 2015-02-25 ENCOUNTER — Ambulatory Visit: Payer: Medicaid Other | Admitting: Gastroenterology

## 2015-02-25 NOTE — Telephone Encounter (Signed)
Tried to call pt- NA- LMOM 

## 2015-02-25 NOTE — Telephone Encounter (Signed)
[phone]   PATIENT WANTS TO KNOW IF SHE CAN TAKE TWO MOVANTIC  PLEASE ADVISE

## 2015-02-26 NOTE — Telephone Encounter (Signed)
It's indicated for once a day, and I am not as familiar with increasing to BID dosing. Do we have the rep information so I can call them?

## 2015-02-26 NOTE — Telephone Encounter (Signed)
Spoke with the pt- she said she was having abd pain and only having a bm once a week. She is taking movanik daily. The only way she can have a bm is if she takes mag citrate and that makes her nauseous when she takes it. She wants to know if she can take 2 movantik?   Routing to AS in LSL absence.

## 2015-02-26 NOTE — Telephone Encounter (Signed)
It is not safe to take BID. Needs to take Miralax daily to BID with it

## 2015-02-26 NOTE — Telephone Encounter (Signed)
I spoke with the rep. She said movantik should only be taken once a day. She recommended taking miralax daily while continuing the movantik.

## 2015-02-28 NOTE — Telephone Encounter (Signed)
Pt is aware. She has upcoming ov.

## 2015-03-13 ENCOUNTER — Ambulatory Visit (INDEPENDENT_AMBULATORY_CARE_PROVIDER_SITE_OTHER): Payer: Self-pay | Admitting: Gastroenterology

## 2015-03-13 ENCOUNTER — Telehealth: Payer: Self-pay

## 2015-03-13 ENCOUNTER — Encounter: Payer: Self-pay | Admitting: Gastroenterology

## 2015-03-13 VITALS — BP 104/62 | HR 82 | Temp 98.9°F | Ht 63.0 in | Wt 100.2 lb

## 2015-03-13 DIAGNOSIS — K5909 Other constipation: Secondary | ICD-10-CM

## 2015-03-13 NOTE — Progress Notes (Signed)
Patient left without being seen. Appointment at 2pm and patient left at 2:30pm.

## 2015-03-13 NOTE — Telephone Encounter (Signed)
Pt was here today for an office visit. She had to leave d/t it was time to pick up her kids at school. She said to tell LSL that she was still having the same problems. (please see 02/25/15 telephone note) she wanted to know if there was anything that she could recommend or does she need to reschedule her ov?

## 2015-03-14 NOTE — Telephone Encounter (Signed)
She has exhausted all options for constipation as single use agents. She will need combined therapy.  Let's start with a colonic purge. Prepopik as directed on package (if covered by insurance) if not covered let me know which bowel prep is on formulary.   Day after purge: Continue Movantik  daily. Continue Amitiza BID with food. Miralax 17 grams BID. Keep stool diary. How many stools per day and whether hard/soft/liquid. Call with progress report in 2-3 weeks.

## 2015-03-14 NOTE — Telephone Encounter (Signed)
Tried to call pt- NA-no voicemail.  

## 2015-03-20 NOTE — Telephone Encounter (Signed)
Tried to call pt- NA- no voicemail- "mailbox was full"

## 2015-03-25 NOTE — Telephone Encounter (Signed)
Tried to call pt- NA and mailbox is full, cannot leave a message.

## 2015-03-26 NOTE — Telephone Encounter (Signed)
Letter mailed to the pt with instructions.  

## 2015-03-31 ENCOUNTER — Other Ambulatory Visit: Payer: Self-pay | Admitting: Nurse Practitioner

## 2015-04-01 ENCOUNTER — Other Ambulatory Visit: Payer: Self-pay

## 2015-04-01 MED ORDER — DOCUSATE SODIUM 100 MG PO CAPS: 100.0000 mg | ORAL_CAPSULE | Freq: Two times a day (BID) | ORAL | Status: DC

## 2015-04-30 ENCOUNTER — Other Ambulatory Visit: Payer: Self-pay | Admitting: Nurse Practitioner

## 2015-05-28 ENCOUNTER — Other Ambulatory Visit: Payer: Self-pay

## 2015-05-29 MED ORDER — LUBIPROSTONE 24 MCG PO CAPS: 24.0000 ug | ORAL_CAPSULE | Freq: Two times a day (BID) | ORAL | Status: AC

## 2015-07-10 ENCOUNTER — Other Ambulatory Visit: Payer: Self-pay | Admitting: Gastroenterology

## 2015-08-26 ENCOUNTER — Other Ambulatory Visit: Payer: Self-pay | Admitting: Gastroenterology

## 2015-10-21 ENCOUNTER — Other Ambulatory Visit: Payer: Self-pay

## 2015-10-23 ENCOUNTER — Other Ambulatory Visit: Payer: Self-pay | Admitting: Nurse Practitioner

## 2015-10-23 ENCOUNTER — Telehealth: Payer: Self-pay | Admitting: Internal Medicine

## 2015-10-23 NOTE — Telephone Encounter (Signed)
Routing to the refill box. 

## 2015-10-23 NOTE — Telephone Encounter (Signed)
Pt called this afternoon to follow up on her zofran prescription. She said that her pharnacy hasn't received anything back from us. Please advise and call her at [phone]

## 2015-10-23 NOTE — Telephone Encounter (Signed)
Just received request from pharmacy at 241pm today. Has been addressed.

## 2015-11-17 ENCOUNTER — Other Ambulatory Visit: Payer: Self-pay | Admitting: Gastroenterology

## 2015-12-12 ENCOUNTER — Other Ambulatory Visit: Payer: Self-pay | Admitting: Gastroenterology

## 2016-01-15 ENCOUNTER — Other Ambulatory Visit: Payer: Self-pay | Admitting: Internal Medicine

## 2016-02-12 ENCOUNTER — Other Ambulatory Visit: Payer: Self-pay | Admitting: Gastroenterology

## 2016-04-06 ENCOUNTER — Other Ambulatory Visit: Payer: Self-pay | Admitting: Nurse Practitioner

## 2016-04-07 ENCOUNTER — Encounter: Payer: Self-pay | Admitting: Internal Medicine

## 2016-04-07 NOTE — Telephone Encounter (Signed)
APPOINTMENT MADE AND LETTER SENT °

## 2016-04-07 NOTE — Telephone Encounter (Signed)
Patient needs routine follow up with RMR only. He has never seen patient in office. Refilled Movantik.

## 2016-04-21 ENCOUNTER — Telehealth: Payer: Self-pay | Admitting: Internal Medicine

## 2016-04-21 MED ORDER — ONDANSETRON HCL 4 MG PO TABS
ORAL_TABLET | ORAL | 3 refills | Status: DC
Start: 2016-04-21 — End: 2016-11-03

## 2016-04-21 NOTE — Telephone Encounter (Signed)
Routing to the refill box. 

## 2016-04-21 NOTE — Telephone Encounter (Signed)
Pt called to verify her OV with RMR next Friday and asked if she could get enough [facility]fran [facility] [place] to [place] her until then.

## 2016-04-21 NOTE — Addendum Note (Signed)
Addended by: Gelene MinkBOONE, Yolinda Duerr W on: 04/21/2016 04:48 PM   Modules accepted: Orders

## 2016-05-01 ENCOUNTER — Ambulatory Visit: Payer: Medicaid Other | Admitting: Internal Medicine

## 2016-05-05 ENCOUNTER — Ambulatory Visit: Payer: Medicaid Other | Admitting: Internal Medicine

## 2016-05-12 ENCOUNTER — Ambulatory Visit: Payer: Medicaid Other | Admitting: Internal Medicine

## 2016-06-03 ENCOUNTER — Telehealth: Payer: Self-pay

## 2016-06-03 ENCOUNTER — Encounter: Payer: Self-pay | Admitting: *Deleted

## 2016-06-03 NOTE — Telephone Encounter (Signed)
OPENED IN ERROR

## 2016-06-04 ENCOUNTER — Ambulatory Visit: Payer: Medicaid Other | Admitting: Cardiology

## 2016-06-04 ENCOUNTER — Encounter: Payer: Self-pay | Admitting: Cardiology

## 2016-06-04 NOTE — Progress Notes (Deleted)
     Clinical Summary [name] is a 30 y.o.female seen today as a new patient.  1. Chest pain/palpitations -    Past Medical History:  Diagnosis Date  . Constipation   . Seasonal allergies      No Known Allergies   Current Outpatient Prescriptions  Medication Sig Dispense Refill  . docusate sodium (COLACE) 100 MG capsule TAKE 1 CAPSULE BY MOUTH TWO TIMES DAILY 60 capsule 5  . loratadine (CLARITIN) 10 MG tablet Take 10 mg by mouth daily.     Marland Kitchen. lubiprostone (AMITIZA) 24 MCG capsule Take 1 capsule (24 mcg total) by mouth 2 (two) times daily with a meal. 60 capsule 11  . MOVANTIK 25 MG TABS tablet TAKE 1 TABLET (25 MG TOTAL) BY MOUTH DAILY. 1 HOUR BEFORE BREAKFAST 30 tablet 5  . ondansetron (ZOFRAN) 4 MG tablet TAKE 1 TABLET BY MOUTH EVERY EIGHT HOURS AS NEEDED FOR NAUSEA OR VOMITING 30 tablet 3  . pantoprazole (PROTONIX) 40 MG tablet TAKE 1 TABLET (40 MG TOTAL) BY MOUTH 2 (TWO) TIMES DAILY BEFORE A MEAL. 60 tablet 5  . PRESCRIPTION MEDICATION Take 1 tablet by mouth daily. Birth control    . SUBOXONE 8-2 MG FILM      No current facility-administered medications for this visit.      Past Surgical History:  Procedure Laterality Date  . COLONOSCOPY N/A 08/15/2014   ZOX:WRUEAVRMR:normal colon  . None       No Known Allergies    Family History  Problem Relation Age of Onset  . Colon cancer Neg Hx      Social History Leah Clark reports that she has been smoking.  She has been smoking about 0.50 packs per day. She does not have any smokeless tobacco history on file. [name] reports that she does not drink alcohol.   Review of Systems CONSTITUTIONAL: No weight loss, fever, chills, weakness or fatigue.  HEENT: Eyes: No visual loss, blurred vision, double vision or yellow sclerae.No hearing loss, sneezing, congestion, runny nose or sore throat.  SKIN: No rash or itching.  CARDIOVASCULAR:  RESPIRATORY: No shortness of breath, cough or sputum.  GASTROINTESTINAL: No anorexia,  nausea, vomiting or diarrhea. No abdominal pain or blood.  GENITOURINARY: No burning on urination, no polyuria NEUROLOGICAL: No headache, dizziness, syncope, paralysis, ataxia, numbness or tingling in the extremities. No change in bowel or bladder control.  MUSCULOSKELETAL: No muscle, back pain, joint pain or stiffness.  LYMPHATICS: No enlarged nodes. No history of splenectomy.  PSYCHIATRIC: No history of depression or anxiety.  ENDOCRINOLOGIC: No reports of sweating, cold or heat intolerance. No polyuria or polydipsia.  Marland Kitchen.   Physical Examination There were no vitals filed for this visit. There were no vitals filed for this visit.  Gen: resting comfortably, no acute distress HEENT: no scleral icterus, pupils equal round and reactive, no palptable cervical adenopathy,  CV Resp: Clear to auscultation bilaterally GI: abdomen is soft, non-tender, non-distended, normal bowel sounds, no hepatosplenomegaly MSK: extremities are warm, no edema.  Skin: warm, no rash Neuro:  no focal deficits Psych: appropriate affect   Diagnostic Studies     Assessment and Plan        Antoine PocheJonathan F. Arcangel Minion, M.D., F.A.C.C.

## 2016-11-03 ENCOUNTER — Other Ambulatory Visit: Payer: Self-pay | Admitting: Gastroenterology

## 2016-11-04 ENCOUNTER — Encounter: Payer: Self-pay | Admitting: Internal Medicine

## 2016-11-04 NOTE — Telephone Encounter (Signed)
Last seen in 02/2015.   Please schedule nonurgent ov with RMR only.   RX for zofran done.

## 2016-11-04 NOTE — Telephone Encounter (Signed)
Please schedule ov.  

## 2016-11-04 NOTE — Telephone Encounter (Signed)
APPOINTMENT MADE AND LETTER SENT °

## 2016-12-29 ENCOUNTER — Ambulatory Visit: Payer: Medicaid Other | Admitting: Internal Medicine

## 2016-12-30 ENCOUNTER — Encounter: Payer: Self-pay | Admitting: Internal Medicine

## 2017-03-05 ENCOUNTER — Telehealth: Payer: Self-pay | Admitting: Internal Medicine

## 2017-03-05 ENCOUNTER — Ambulatory Visit: Payer: Medicaid Other | Admitting: Internal Medicine

## 2017-03-05 NOTE — Telephone Encounter (Signed)
I will call the patient next week and speak with her in regards to her multiple cancellations.

## 2017-03-05 NOTE — Telephone Encounter (Signed)
Patient called and left message to cancel appointment.  This has happened multiple times.

## 2017-03-09 ENCOUNTER — Ambulatory Visit: Payer: Self-pay | Admitting: Internal Medicine

## 2017-03-09 NOTE — Telephone Encounter (Signed)
I tried to call the patient and no answer, unable to leave a message

## 2017-04-10 ENCOUNTER — Other Ambulatory Visit: Payer: Self-pay | Admitting: Nurse Practitioner

## 2017-06-01 ENCOUNTER — Other Ambulatory Visit: Payer: Self-pay | Admitting: Gastroenterology

## 2017-06-14 ENCOUNTER — Other Ambulatory Visit: Payer: Self-pay

## 2017-06-16 MED ORDER — ONDANSETRON HCL 4 MG PO TABS: ORAL_TABLET | ORAL | 3 refills | Status: AC

## 2018-07-26 ENCOUNTER — Other Ambulatory Visit: Payer: Self-pay | Admitting: Gastroenterology

## 2018-07-28 ENCOUNTER — Telehealth: Payer: Self-pay | Admitting: Gastroenterology

## 2018-07-28 NOTE — Telephone Encounter (Signed)
The patient will need an ov, because we have not discharged her from our practice.

## 2018-07-28 NOTE — Telephone Encounter (Signed)
Received request from pharmacy for refill for zofran. I have declined to fill any prescriptions for patient until she is seen in the office.   She was last seen in 09/2014. She was in our office 02/2015 but left without being seen. She has seen cancelled 11 appointments with our practice.   Durward Mallard, should we offer patient an appointment to reestablish care??

## 2018-08-01 NOTE — Telephone Encounter (Signed)
Called patient and l/m for her to call and schedule an appointment

## 2018-10-20 ENCOUNTER — Other Ambulatory Visit: Payer: Self-pay | Admitting: Gastroenterology

## 2020-01-09 ENCOUNTER — Ambulatory Visit: Payer: Medicaid Other | Admitting: Sports Medicine

## 2020-01-09 ENCOUNTER — Other Ambulatory Visit: Payer: Self-pay

## 2020-01-09 ENCOUNTER — Other Ambulatory Visit: Payer: Self-pay | Admitting: Sports Medicine

## 2020-01-09 ENCOUNTER — Encounter: Payer: Self-pay | Admitting: Sports Medicine

## 2020-01-09 ENCOUNTER — Ambulatory Visit (INDEPENDENT_AMBULATORY_CARE_PROVIDER_SITE_OTHER): Payer: Medicaid Other

## 2020-01-09 DIAGNOSIS — M722 Plantar fascial fibromatosis: Secondary | ICD-10-CM

## 2020-01-09 DIAGNOSIS — M79672 Pain in left foot: Secondary | ICD-10-CM

## 2020-01-09 DIAGNOSIS — G5750 Tarsal tunnel syndrome, unspecified lower limb: Secondary | ICD-10-CM

## 2020-01-09 MED ORDER — MELOXICAM 15 MG PO TABS: 15.0000 mg | ORAL_TABLET | Freq: Every day | ORAL | 0 refills | Status: DC

## 2020-01-09 MED ORDER — TRIAMCINOLONE ACETONIDE 10 MG/ML IJ SUSP: 10.0000 mg | Freq: Once | INTRAMUSCULAR | Status: AC

## 2020-01-09 NOTE — Progress Notes (Signed)
Subjective: Leah Clark is a 34 y.o. female patient presents to office with complaint of moderate heel pain on the left and right. 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  Denies any other pedal complaints.   Review of Systems  All other systems reviewed and are negative.    Patient Active Problem List   Diagnosis Date Noted  . Drug-induced constipation 10/08/2014  . GERD (gastroesophageal reflux disease) 10/08/2014  . Rectal bleeding 07/25/2014  . CN (constipation) 05/25/2014    Current Outpatient Medications on File Prior to Visit  Medication Sig Dispense Refill  . docusate sodium (COLACE) 100 MG capsule TAKE 1 CAPSULE BY MOUTH TWO TIMES DAILY 60 capsule 5  . loratadine (CLARITIN) 10 MG tablet Take 10 mg by mouth daily.     Marland Kitchen lubiprostone (AMITIZA) 24 MCG capsule Take 1 capsule (24 mcg total) by mouth 2 (two) times daily with a meal. 60 capsule 11  . MOVANTIK 25 MG TABS tablet TAKE 1 TABLET (25 MG TOTAL) BY MOUTH DAILY. 1 HOUR BEFORE BREAKFAST 30 tablet 5  . ondansetron (ZOFRAN) 4 MG tablet TAKE 1 TABLET BY MOUTH EVERY EIGHT HOURS AS NEEDED FOR NAUSEA OR VOMITING 60 tablet 3  . pantoprazole (PROTONIX) 40 MG tablet TAKE 1 TABLET (40 MG TOTAL) BY MOUTH 2 (TWO) TIMES DAILY BEFORE A MEAL. 60 tablet 2  . PRESCRIPTION MEDICATION Take 1 tablet by mouth daily. Birth control    . SUBOXONE 8-2 MG FILM      No current facility-administered medications on file prior to visit.    No Known Allergies  Objective: Physical Exam General: The patient is alert and oriented x3 in no acute distress.  Dermatology: Skin is warm, dry and supple bilateral lower extremities. Nails 1-10 are normal. There is no erythema,  edema, no eccymosis, no open lesions present. Integument is otherwise unremarkable.  Vascular: Dorsalis Pedis pulse 2/4 and Posterior Tibial pulse are 1/4 bilateral. Capillary fill time is immediate to all digits.  Neurological: Grossly intact to light touch with subjective tingling bilateral possible superimposed tarsal tunnel syndrome.  Musculoskeletal: Tenderness to palpation at the medial calcaneal tubercale and through the insertion of the plantar fascia extending into the arches on the left and right foot. No pain with compression of calcaneus bilateral. No pain with tuning fork to calcaneus bilateral. No pain with calf compression bilateral. There is decreased Ankle joint range of motion bilateral. All other joints range of motion within normal limits bilateral. Strength 5/5 in all groups bilateral.   Gait: Unassisted, Antalgic avoid weight on heels  Xray, Right/Left foot:  Normal osseous mineralization. Joint spaces preserved. No fracture/dislocation/boney destruction.  Minimal calcaneal spur present with mild thickening of plantar fascia. No other soft tissue abnormalities or radiopaque foreign bodies.   Assessment and Plan: Problem List Items Addressed This Visit    None    Visit Diagnoses    Foot pain, bilateral    -  Primary   Relevant Orders   DG Foot Complete Right   DG Foot Complete Left      -Complete examination performed.  -Xrays reviewed -Discussed with patient in detail the condition of plantar fasciitis, how this occurs and general treatment  options. Explained both conservative and surgical treatments.  -After oral consent and aseptic prep, injected a mixture containing 1 ml of 2%  plain lidocaine, 1 ml 0.5% plain marcaine, 0.5 ml of kenalog 10 and 0.5 ml of dexamethasone phosphate into left and right heel. Post-injection care discussed with patient.  -Rx Meloxicam -Did not give oral steroids since patient does not like the way they make her feel reports that they  make her feel jittery -Recommended good supportive shoes and advised use of heel lifts as dispensed this visit -Explained and dispensed to patient daily stretching exercises. -Recommend patient to ice affected area 1-2x daily. -Patient to return to office in 4 weeks for follow up or sooner if problems or questions arise.  Asencion Islam, DPM

## 2020-01-09 NOTE — Patient Instructions (Signed)
Plantar Fasciitis Rehab Ask your health care provider which exercises are safe for you. Do exercises exactly as told by your health care provider and adjust them as directed. It is normal to feel mild stretching, pulling, tightness, or discomfort as you do these exercises. Stop right away if you feel sudden pain or your pain gets worse. Do not begin these exercises until told by your health care provider. Stretching and range-of-motion exercises These exercises warm up your muscles and joints and improve the movement and flexibility of your foot. These exercises also help to relieve pain. Plantar fascia stretch  1. Sit with your left / right leg crossed over your opposite knee. 2. Hold your heel with one hand with that thumb near your arch. With your other hand, hold your toes and gently pull them back toward the top of your foot. You should feel a stretch on the bottom of your toes or your foot (plantar fascia) or both. 3. Hold this stretch for__________ seconds. 4. Slowly release your toes and return to the starting position. Repeat __________ times. Complete this exercise __________ times a day. Gastrocnemius stretch, standing This exercise is also called a calf (gastroc) stretch. It stretches the muscles in the back of the upper calf. 1. Stand with your hands against a wall. 2. Extend your left / right leg behind you, and bend your front knee slightly. 3. Keeping your heels on the floor and your back knee straight, shift your weight toward the wall. Do not arch your back. You should feel a gentle stretch in your upper left / right calf. 4. Hold this position for __________ seconds. Repeat __________ times. Complete this exercise __________ times a day. Soleus stretch, standing This exercise is also called a calf (soleus) stretch. It stretches the muscles in the back of the lower calf. 1. Stand with your hands against a wall. 2. Extend your left / right leg behind you, and bend your front  knee slightly. 3. Keeping your heels on the floor, bend your back knee and shift your weight slightly over your back leg. You should feel a gentle stretch deep in your lower calf. 4. Hold this position for __________ seconds. Repeat __________ times. Complete this exercise __________ times a day. Gastroc and soleus stretch, standing step This exercise stretches the muscles in the back of the lower leg. These muscles are in the upper calf (gastrocnemius) and the lower calf (soleus). 1. Stand with the ball of your left / right foot on a step. The ball of your foot is on the walking surface, right under your toes. 2. Keep your other foot firmly on the same step. 3. Hold on to the wall or a railing for balance. 4. Slowly lift your other foot, allowing your body weight to press your left / right heel down over the edge of the step. You should feel a stretch in your left / right calf. 5. Hold this position for __________ seconds. 6. Return both feet to the step. 7. Repeat this exercise with a slight bend in your left / right knee. Repeat __________ times with your left / right knee straight and __________ times with your left / right knee bent. Complete this exercise __________ times a day. Balance exercise This exercise builds your balance and strength control of your arch to help take pressure off your plantar fascia. Single leg stand If this exercise is too easy, you can try it with your eyes closed or while standing on a pillow. 1.   Without shoes, stand near a railing or in a doorway. You may hold on to the railing or door frame as needed. 2. Stand on your left / right foot. Keep your big toe down on the floor and try to keep your arch lifted. Do not let your foot roll inward. 3. Hold this position for __________ seconds. Repeat __________ times. Complete this exercise __________ times a day. This information is not intended to replace advice given to you by your health care provider. Make sure  you discuss any questions you have with your health care provider. Document Revised: 09/22/2018 Document Reviewed: 03/30/2018 [employer] Patient Education  2020 [employer]  

## 2020-02-06 ENCOUNTER — Other Ambulatory Visit: Payer: Self-pay

## 2020-02-06 MED ORDER — MELOXICAM 15 MG PO TABS: 15.0000 mg | ORAL_TABLET | Freq: Every day | ORAL | 0 refills | Status: AC

## 2020-02-08 ENCOUNTER — Ambulatory Visit: Payer: Medicaid Other | Admitting: Sports Medicine

## 2020-02-15 ENCOUNTER — Encounter: Payer: Self-pay | Admitting: Sports Medicine

## 2020-02-15 ENCOUNTER — Other Ambulatory Visit: Payer: Self-pay

## 2020-02-15 ENCOUNTER — Ambulatory Visit (INDEPENDENT_AMBULATORY_CARE_PROVIDER_SITE_OTHER): Payer: Medicaid Other | Admitting: Sports Medicine

## 2020-02-15 DIAGNOSIS — G5752 Tarsal tunnel syndrome, left lower limb: Secondary | ICD-10-CM | POA: Diagnosis not present

## 2020-02-15 DIAGNOSIS — G5751 Tarsal tunnel syndrome, right lower limb: Secondary | ICD-10-CM

## 2020-02-15 DIAGNOSIS — M79672 Pain in left foot: Secondary | ICD-10-CM

## 2020-02-15 DIAGNOSIS — G5750 Tarsal tunnel syndrome, unspecified lower limb: Secondary | ICD-10-CM

## 2020-02-15 DIAGNOSIS — M722 Plantar fascial fibromatosis: Secondary | ICD-10-CM | POA: Diagnosis not present

## 2020-02-15 MED ORDER — DICLOFENAC SODIUM 75 MG PO TBEC: 75.0000 mg | DELAYED_RELEASE_TABLET | Freq: Two times a day (BID) | ORAL | 0 refills | Status: AC

## 2020-02-15 NOTE — Progress Notes (Signed)
Subjective: Leah Clark is a 34 y.o. female patient returns office for follow-up evaluation of bilateral foot pain.  Patient reports that there is still pain in her heels and the bottoms of both feet and tingling to toes.  Patient reports that after last visit when she received a steroid injection it helped initially for the first few days but then as the medicine slowly start to wear off the pain started to come back in her feet.  Patient denies any other pedal complaints at this time.   Patient Active Problem List   Diagnosis Date Noted  . Drug-induced constipation 10/08/2014  . GERD (gastroesophageal reflux disease) 10/08/2014  . Rectal bleeding 07/25/2014  . CN (constipation) 05/25/2014    Current Outpatient Medications on File Prior to Visit  Medication Sig Dispense Refill  . docusate sodium (COLACE) 100 MG capsule TAKE 1 CAPSULE BY MOUTH TWO TIMES DAILY 60 capsule 5  . loratadine (CLARITIN) 10 MG tablet Take 10 mg by mouth daily.     Marland Kitchen lubiprostone (AMITIZA) 24 MCG capsule Take 1 capsule (24 mcg total) by mouth 2 (two) times daily with a meal. 60 capsule 11  . meloxicam (MOBIC) 15 MG tablet Take 1 tablet (15 mg total) by mouth daily. 30 tablet 0  . MOVANTIK 25 MG TABS tablet TAKE 1 TABLET (25 MG TOTAL) BY MOUTH DAILY. 1 HOUR BEFORE BREAKFAST 30 tablet 5  . ondansetron (ZOFRAN) 4 MG tablet TAKE 1 TABLET BY MOUTH EVERY EIGHT HOURS AS NEEDED FOR NAUSEA OR VOMITING 60 tablet 3  . pantoprazole (PROTONIX) 40 MG tablet TAKE 1 TABLET (40 MG TOTAL) BY MOUTH 2 (TWO) TIMES DAILY BEFORE A MEAL. 60 tablet 2  . PRESCRIPTION MEDICATION Take 1 tablet by mouth daily. Birth control    . SUBOXONE 8-2 MG FILM      Current Facility-Administered Medications on File Prior to Visit  Medication Dose Route Frequency Provider Last Rate Last Admin  . triamcinolone acetonide (KENALOG) 10 MG/ML injection 10 mg  10 mg Other Once Landis Martins, DPM        No Known Allergies  Objective: Physical  Exam General: The patient is alert and oriented x3 in no acute distress.  Dermatology: Skin is warm, dry and supple bilateral lower extremities. Nails 1-10 are normal. There is no erythema, edema, no eccymosis, no open lesions present. Integument is otherwise unremarkable.  Vascular: Dorsalis Pedis pulse 2/4 and Posterior Tibial pulse are 1/4 bilateral. Capillary fill time is immediate to all digits.  Neurological: Grossly intact to light touch with subjective tingling bilateral possible superimposed tarsal tunnel syndrome.  Tinel's sign positive.  Musculoskeletal: Tenderness to palpation at the medial calcaneal tubercale and through the insertion of the plantar fascia extending into the arches on the left and right foot. No pain with compression of calcaneus bilateral. No pain with calf compression bilateral. There is decreased Ankle joint range of motion bilateral. All other joints range of motion within normal limits bilateral. Strength 5/5 in all groups bilateral.   Assessment and Plan: Problem List Items Addressed This Visit    None    Visit Diagnoses    Plantar fasciitis, bilateral    -  Primary   Tarsal tunnel syndrome, unspecified laterality       Relevant Orders   [id number] Antigen   Sedimentation rate   Uric acid   Rheumatoid factor   C-reactive protein   ANA   Foot pain, bilateral          -Complete  examination performed.  -Discussed with patient in detail the condition of plantar fasciitis with tarsal tunnel syndrome versus underlying inflammatory condition, how this occurs and general treatment options. Explained both conservative and surgical treatments.  -At this time patient elects to hold off on another injection -Arthritic panel for further evaluation for possible underlying inflammatory condition -Rx diclofenac to see if this will give her better relief -Applied plantar fascial strapping bilateral and advised patient if this provides her some relief may benefit from  orthotics -Recommended continue with daily stretching and icing as instructed -Patient to return to office in 2-3 weeks for follow up or sooner if problems or questions arise.  Advised patient if she is still having pain next time should consider reinjection.  Landis Martins, DPM

## 2020-03-07 ENCOUNTER — Ambulatory Visit (INDEPENDENT_AMBULATORY_CARE_PROVIDER_SITE_OTHER): Payer: Medicaid Other | Admitting: Sports Medicine

## 2020-03-07 ENCOUNTER — Other Ambulatory Visit: Payer: Self-pay

## 2020-03-07 ENCOUNTER — Encounter: Payer: Self-pay | Admitting: Sports Medicine

## 2020-03-07 DIAGNOSIS — M79672 Pain in left foot: Secondary | ICD-10-CM

## 2020-03-07 DIAGNOSIS — M722 Plantar fascial fibromatosis: Secondary | ICD-10-CM

## 2020-03-07 DIAGNOSIS — G5750 Tarsal tunnel syndrome, unspecified lower limb: Secondary | ICD-10-CM

## 2020-03-07 MED ORDER — DICLOFENAC SODIUM 75 MG PO TBEC: 75.0000 mg | DELAYED_RELEASE_TABLET | Freq: Two times a day (BID) | ORAL | 0 refills | Status: DC

## 2020-03-07 NOTE — Progress Notes (Signed)
Subjective: Leah Clark is a 34 y.o. female patient returns office for follow-up evaluation of bilateral foot pain.  Patient reports that pain is much better 2/10 Diclofenac is helping and pain is less still a little tingling.  Patient denies any other pedal complaints at this time.   Patient Active Problem List   Diagnosis Date Noted  . Drug-induced constipation 10/08/2014  . GERD (gastroesophageal reflux disease) 10/08/2014  . Rectal bleeding 07/25/2014  . CN (constipation) 05/25/2014    Current Outpatient Medications on File Prior to Visit  Medication Sig Dispense Refill  . diclofenac (VOLTAREN) 75 MG EC tablet Take 1 tablet (75 mg total) by mouth 2 (two) times daily. 30 tablet 0  . docusate sodium (COLACE) 100 MG capsule TAKE 1 CAPSULE BY MOUTH TWO TIMES DAILY 60 capsule 5  . loratadine (CLARITIN) 10 MG tablet Take 10 mg by mouth daily.     Marland Kitchen lubiprostone (AMITIZA) 24 MCG capsule Take 1 capsule (24 mcg total) by mouth 2 (two) times daily with a meal. 60 capsule 11  . meloxicam (MOBIC) 15 MG tablet Take 1 tablet (15 mg total) by mouth daily. 30 tablet 0  . MOVANTIK 25 MG TABS tablet TAKE 1 TABLET (25 MG TOTAL) BY MOUTH DAILY. 1 HOUR BEFORE BREAKFAST 30 tablet 5  . ondansetron (ZOFRAN) 4 MG tablet TAKE 1 TABLET BY MOUTH EVERY EIGHT HOURS AS NEEDED FOR NAUSEA OR VOMITING 60 tablet 3  . pantoprazole (PROTONIX) 40 MG tablet TAKE 1 TABLET (40 MG TOTAL) BY MOUTH 2 (TWO) TIMES DAILY BEFORE A MEAL. 60 tablet 2  . PRESCRIPTION MEDICATION Take 1 tablet by mouth daily. Birth control    . SUBOXONE 8-2 MG FILM      Current Facility-Administered Medications on File Prior to Visit  Medication Dose Route Frequency Provider Last Rate Last Admin  . triamcinolone acetonide (KENALOG) 10 MG/ML injection 10 mg  10 mg Other Once Asencion Islam, DPM        No Known Allergies  Objective: Physical Exam General: The patient is alert and oriented x3 in no acute distress.  Dermatology: Skin is warm, dry  and supple bilateral lower extremities. Nails 1-10 are normal. There is no erythema, edema, no eccymosis, no open lesions present. Integument is otherwise unremarkable.  Vascular: Dorsalis Pedis pulse 2/4 and Posterior Tibial pulse are 1/4 bilateral. Capillary fill time is immediate to all digits.  Neurological: Grossly intact to light touch with subjective tingling bilateral possible superimposed tarsal tunnel syndrome.  Tinel's sign positive improving in nature  Musculoskeletal: Decreased tenderness to palpation at the medial calcaneal tubercale and through the insertion of the plantar fascia extending into the arches on the left and right foot. No pain with compression of calcaneus bilateral. No pain with calf compression bilateral. There is decreased Ankle joint range of motion bilateral. All other joints range of motion within normal limits bilateral. Strength 5/5 in all groups bilateral.   Assessment and Plan: Problem List Items Addressed This Visit    None    Visit Diagnoses    Plantar fasciitis, bilateral    -  Primary   Tarsal tunnel syndrome, unspecified laterality       Foot pain, bilateral          -Complete examination performed.  -Re-Discussed with patient in detail the condition of plantar fasciitis with tarsal tunnel syndrome  -Arthritic panel is normal -Continue with diclofenac for 1 month course  -Advised braces or use of OTC insoles -Recommended continue with daily  stretching and icing as instructed -Patient to return to office if fails to continue to improve for possible reinjection.  Asencion Islam, DPM

## 2020-03-22 ENCOUNTER — Other Ambulatory Visit: Payer: Self-pay | Admitting: Sports Medicine

## 2020-03-22 ENCOUNTER — Other Ambulatory Visit: Payer: Self-pay

## 2020-04-10 ENCOUNTER — Other Ambulatory Visit: Payer: Self-pay | Admitting: Sports Medicine

## 2020-04-11 NOTE — Telephone Encounter (Signed)
Please advise 

## 2021-08-15 ENCOUNTER — Other Ambulatory Visit: Payer: Self-pay | Admitting: Neurosurgery

## 2021-08-15 DIAGNOSIS — M542 Cervicalgia: Secondary | ICD-10-CM

## 2021-09-09 ENCOUNTER — Other Ambulatory Visit: Payer: Medicaid Other

## 2021-09-18 ENCOUNTER — Other Ambulatory Visit: Payer: Medicaid Other

## 2021-09-27 ENCOUNTER — Ambulatory Visit
Admission: RE | Admit: 2021-09-27 | Discharge: 2021-09-27 | Disposition: A | Discharge status: Home / Self Care | Payer: Medicaid Other | Source: Ambulatory Visit | Attending: Neurosurgery | Admitting: Neurosurgery

## 2021-09-27 DIAGNOSIS — M542 Cervicalgia: Secondary | ICD-10-CM

## 2023-12-28 IMAGING — MR MR CERVICAL SPINE W/O CM
4 of 5 series · 29 of 48 positions shown · non-contrast
Comparison: Previous MRI from 07/08/2020.

CLINICAL DATA: Initial evaluation for neck pain with bilateral
shoulder pain.

EXAM:
MRI CERVICAL SPINE WITHOUT CONTRAST
TECHNIQUE: Multiplanar, multisequence MR imaging of the cervical spine was
performed. No intravenous contrast was administered.

[Series 2: T2 · sagittal · 3.0mm · 0.66mm/px · 8 of 15 slices shown (1 of 2)]
[im 1/15]
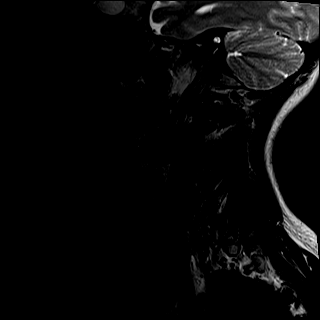
[im 3/15]
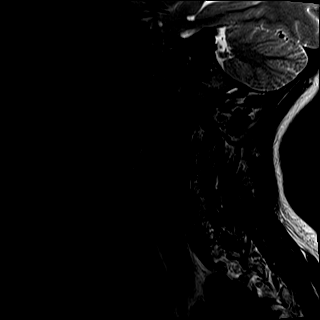
[im 5/15]
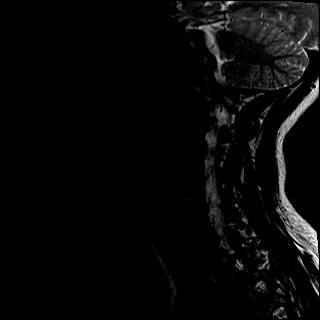
[im 7/15]
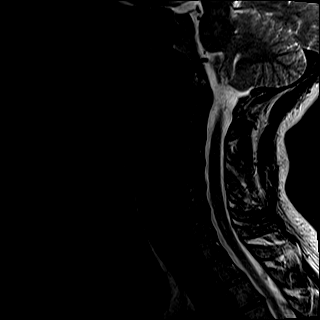
[im 9/15]
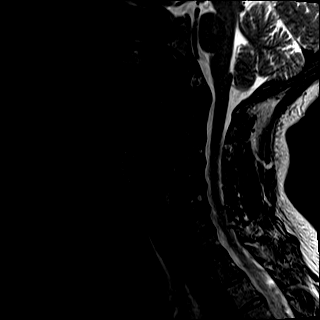
[im 11/15]
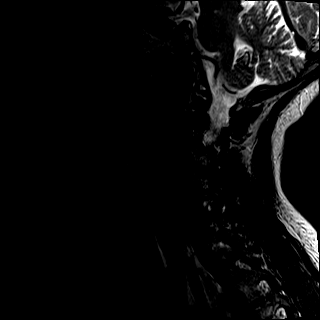
[im 13/15]
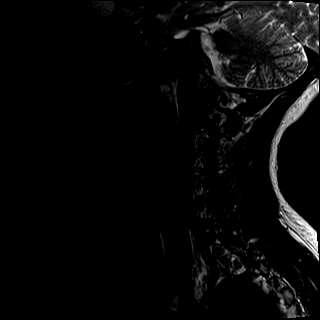
[im 15/15]
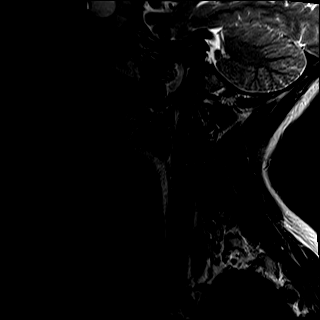

[Series 3: T1 · sagittal · 3.0mm · 0.41mm/px · 7 of 15 slices shown]
[im 1/15]
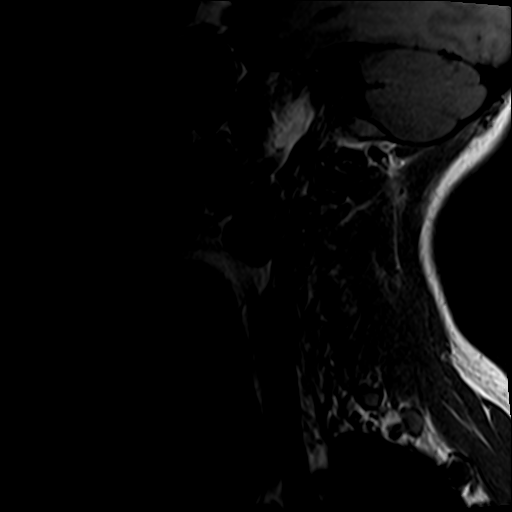
[im 3/15]
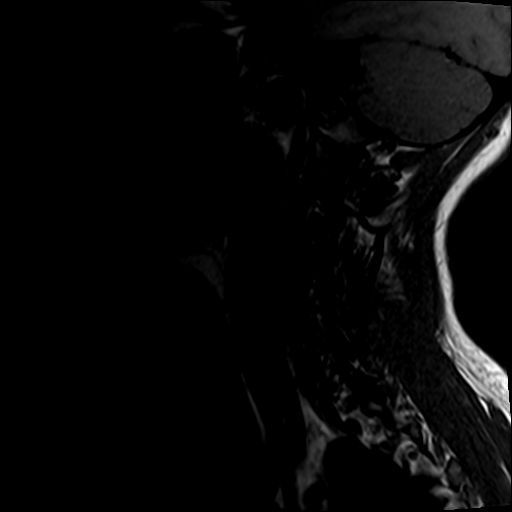
[im 5/15]
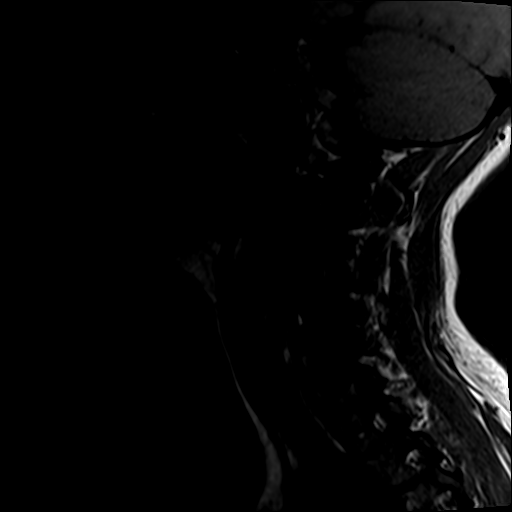
[im 8/15]
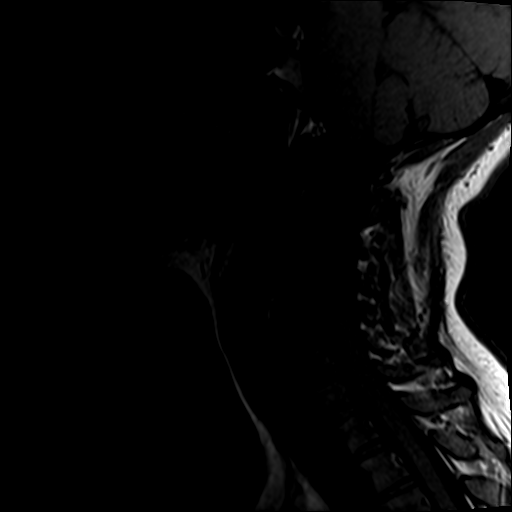
[im 10/15]
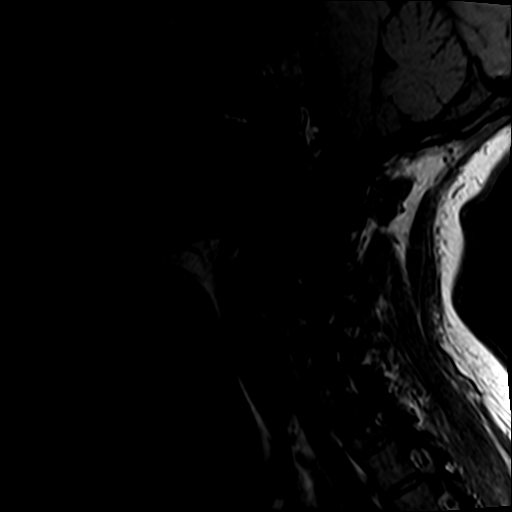
[im 12/15]
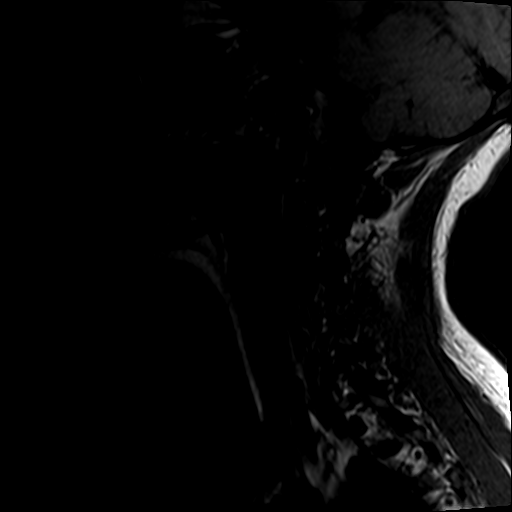
[im 15/15]
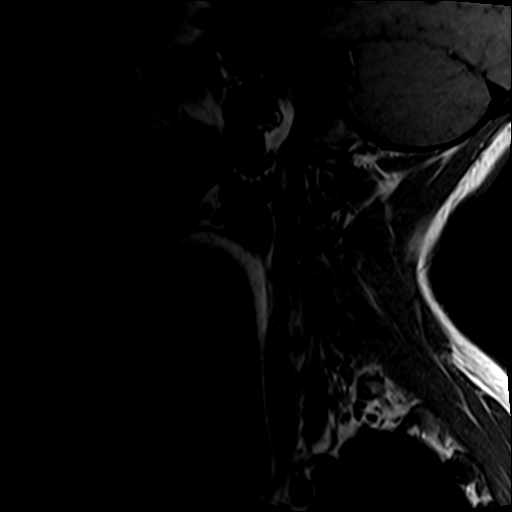

[Series 4: tir sag · sagittal · 3.0mm · 0.41mm/px · 5 of 15 slices shown]
[im 1/15]
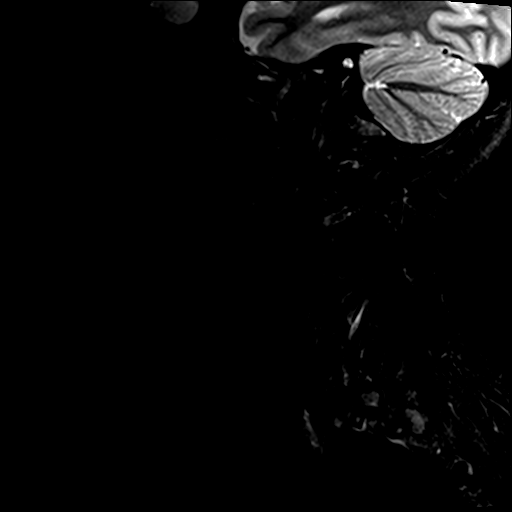
[im 3/15]
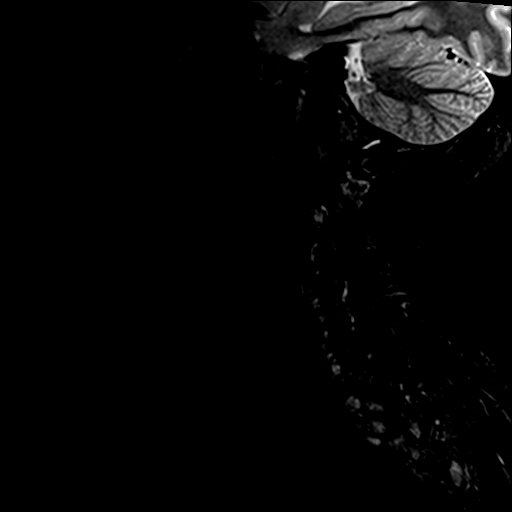
[im 5/15]
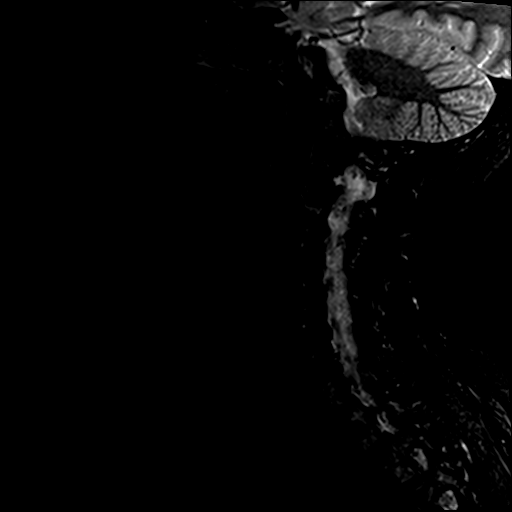
[im 8/15]
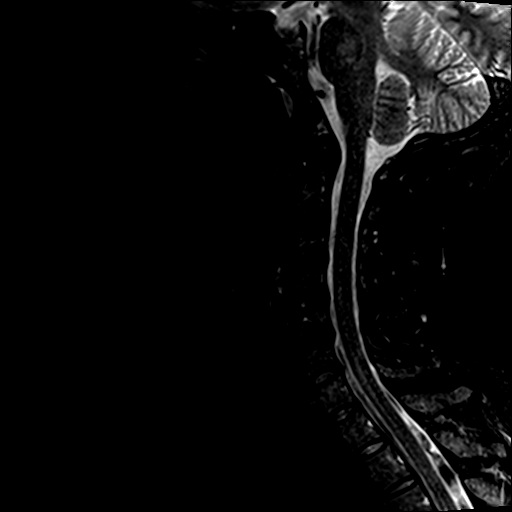
[im 12/15]
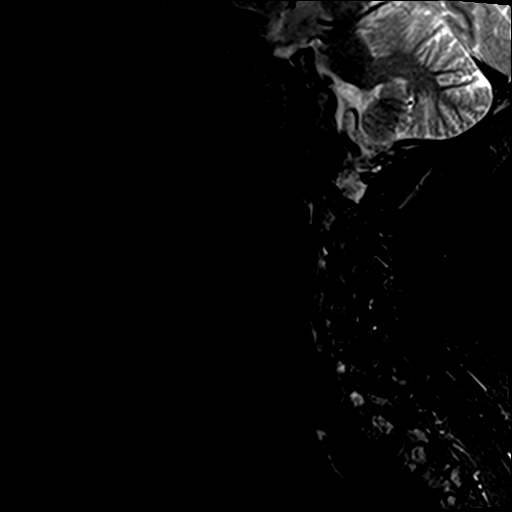

[Series 6: T2 · axial · 3.0mm · 0.70mm/px · z∈[-64,+32]mm · 9 of 27 slices shown (2 of 2)]
[im 1/27]
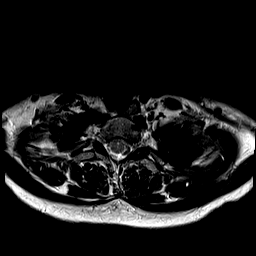
[im 5/27]
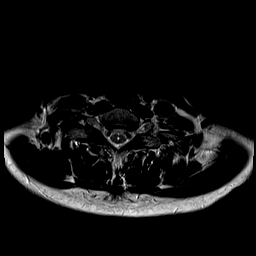
[im 9/27]
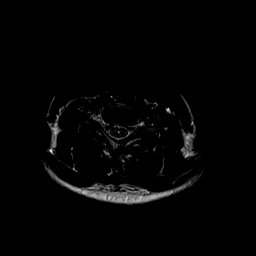
[im 11/27]
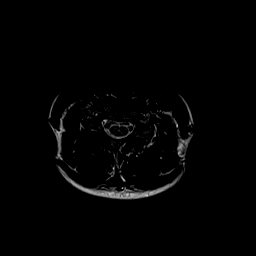
[im 14/27]
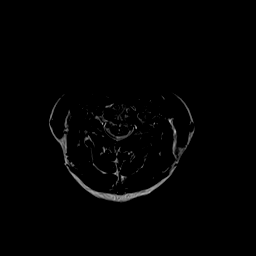
[im 16/27]
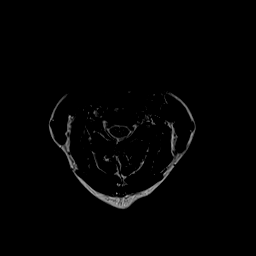
[im 18/27]
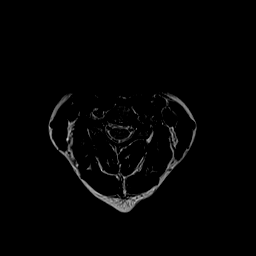
[im 22/27]
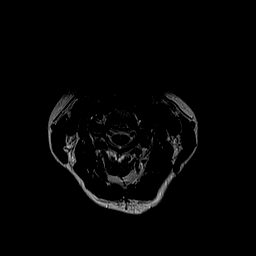
[im 27/27]
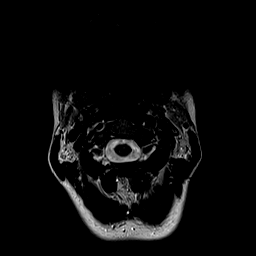

[29 of 48 positions shown; findings below may reference images not displayed]

FINDINGS: Alignment: Physiologic with preservation of the normal cervical
lordosis. No listhesis.

Vertebrae: Vertebral body height maintained without acute or chronic
fracture. Bone marrow signal intensity somewhat diffusely decreased
on T1 weighted imaging, nonspecific, but most commonly related to
anemia, smoking or obesity. Subcentimeter benign hemangioma noted
within the C5 vertebral body. No worrisome osseous lesions. No
abnormal marrow edema.

Cord: Small syrinx involving the lower cervical spinal cord,
extending from C6 through T1, measuring 2 mm in maximal transverse
diameter. Otherwise normal signal and morphology.

Posterior Fossa, vertebral arteries, paraspinal tissues: Visualized
brain and posterior fossa within normal limits. No Chiari
malformation. Paraspinous soft tissues normal. Normal flow voids
seen within the vertebral arteries bilaterally.

Disc levels:

C2-C3: Unremarkable.

C3-C4: Mild disc bulge with disc desiccation. No spinal stenosis.
Foramina remain patent.

C4-C5: Mild disc bulge with uncovertebral spurring. No spinal
stenosis. Foramina remain patent.

C5-C6: Mild disc bulge with left greater than right uncovertebral
spurring. No spinal stenosis. Foramina remain patent.

C6-C7: Minimal uncovertebral hypertrophy without significant disc
bulge. No stenosis.

C7-T1:  Normal interspace.  Mild facet hypertrophy.  No stenosis.

Visualized upper thoracic spine demonstrates no significant finding.
IMPRESSION: 1. Mild noncompressive disc bulging with uncovertebral spurring at
C3-4 through C6-7 without significant stenosis or neural
impingement.
2. Small lower cervical spinal cord syrinx as above.
